# Patient Record
Sex: Female | Born: 2000 | Race: White | Hispanic: No | Marital: Single
Health system: Midwestern US, Community
[De-identification: ages and names within clinical notes are randomized; demographics above are authoritative.]

## PROBLEM LIST (undated history)

## (undated) DIAGNOSIS — Z113 Encounter for screening for infections with a predominantly sexual mode of transmission: Secondary | ICD-10-CM

## (undated) DIAGNOSIS — F909 Attention-deficit hyperactivity disorder, unspecified type: Secondary | ICD-10-CM

## (undated) HISTORY — DX: Attention-deficit hyperactivity disorder, unspecified type: F90.9

## (undated) HISTORY — PX: OTHER SURGICAL HISTORY: SHX169

---
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
## 2019-01-08 ENCOUNTER — Encounter: Payer: Self-pay | Admitting: Family Medicine

## 2019-01-08 ENCOUNTER — Ambulatory Visit (HOSPITAL_BASED_OUTPATIENT_CLINIC_OR_DEPARTMENT_OTHER): Payer: Medicaid Other | Admitting: Pharmacist

## 2019-01-08 ENCOUNTER — Ambulatory Visit: Payer: Medicaid Other | Attending: Family Medicine | Admitting: Family Medicine

## 2019-01-08 ENCOUNTER — Other Ambulatory Visit: Payer: Self-pay

## 2019-01-08 VITALS — BP 114/78 | HR 59 | Temp 98.4°F | Resp 16 | Ht 63.0 in | Wt 124.2 lb

## 2019-01-08 DIAGNOSIS — E010 Iodine-deficiency related diffuse (endemic) goiter: Secondary | ICD-10-CM | POA: Diagnosis present

## 2019-01-08 DIAGNOSIS — Z23 Encounter for immunization: Secondary | ICD-10-CM

## 2019-01-08 DIAGNOSIS — M25561 Pain in right knee: Secondary | ICD-10-CM | POA: Diagnosis not present

## 2019-01-08 DIAGNOSIS — N926 Irregular menstruation, unspecified: Secondary | ICD-10-CM

## 2019-01-08 DIAGNOSIS — K5900 Constipation, unspecified: Secondary | ICD-10-CM | POA: Diagnosis not present

## 2019-01-08 DIAGNOSIS — R7989 Other specified abnormal findings of blood chemistry: Secondary | ICD-10-CM | POA: Diagnosis not present

## 2019-01-08 DIAGNOSIS — Z79899 Other long term (current) drug therapy: Secondary | ICD-10-CM | POA: Diagnosis not present

## 2019-01-08 MED ORDER — POLYETHYLENE GLYCOL 3350 17 GM/SCOOP PO POWD: ORAL | 99 refills | Status: DC

## 2019-01-08 NOTE — Progress Notes (Signed)
Patient presents for vaccination against influenza per orders of Dr. Fulp. Consent given. Counseling provided. No contraindications exists. Vaccine administered without incident.   

## 2019-01-08 NOTE — Patient Instructions (Signed)

## 2019-01-08 NOTE — Progress Notes (Signed)
Subjective:  Patient ID: Kristen Boone, female    DOB: [birth year]  Age: 18 y.o. MRN: [id number]  CC: New Patient (Initial Visit)   HPI Kristen Boone presents to establish care.  She reports that she is too old to attend the doctor's office where she was seen in the past.  She denies any significant past medical history/medical issues.  She states that she does not currently take any daily medications.  She did have her wisdom teeth extracted earlier this week.  She has had no other surgeries.  She has had issues with her periods being irregular and she is not sure when she had her last menstrual period.  She states that she is never been sexually active.  She denies any current symptoms.  She has had no fever or chills, no changes in vision, no sore throat or difficulty swallowing, no shortness of breath or cough, no chest pain or palpitations.  She denies any abdominal pain but upon questioning, she states that she has had longstanding issues with constipation and she normally only has a bowel movement once or twice per week.  She has been on MiraLAX in the past.  She states that this did help.  She cannot recall when she last had a bowel movement.  She is currently on ibuprofen as well as oxycodone after her dental extractions.  She denies nausea or vomiting, no blood in the stool and no black stools.  She does occasionally have pain in her right knee but denies any injury to the right knee.  She denies any peripheral edema/swelling.  Past Medical History:  Diagnosis Date   ADHD (attention deficit hyperactivity disorder)     Past Surgical History:  Procedure Laterality Date   extraction of wisdom teeth      Family History  Problem Relation Age of Onset   Learning disabilities Mother    Hypertension Maternal Grandmother    Asthma Maternal Grandmother     Social History   Tobacco Use   Smoking status: Never Smoker   Smokeless tobacco: Never Used  Substance Use Topics    Alcohol use: No    ROS Review of Systems  Constitutional: Negative for chills, fatigue and fever.  HENT: Positive for dental problem (s/p extraction of wisdon teeth). Negative for sore throat and trouble swallowing.   Eyes: Negative for photophobia and visual disturbance.  Respiratory: Negative for cough and shortness of breath.   Cardiovascular: Negative for chest pain, palpitations and leg swelling.  Gastrointestinal: Positive for constipation. Negative for abdominal pain, blood in stool, diarrhea and nausea.  Endocrine: Negative for cold intolerance, heat intolerance, polydipsia, polyphagia and polyuria.  Genitourinary: Positive for menstrual problem (irregular menses; denies ever being sexually active). Negative for dysuria and frequency.  Musculoskeletal: Positive for arthralgias (recurrent right knee pain). Negative for back pain and gait problem.  Allergic/Immunologic: Positive for food allergies. Negative for environmental allergies and immunocompromised state.  Neurological: Negative for dizziness and headaches.  Hematological: Negative for adenopathy. Does not bruise/bleed easily.    Objective:   Today's Vitals: BP 114/78    Pulse (!) 59    Temp 98.4 F (36.9 C) (Oral)    Resp 16    Ht 5\' 3"  (1.6 m)    Wt 124 lb 3.2 oz (56.3 kg)    SpO2 100%    BMI 22.00 kg/m   Physical Exam Vitals signs and nursing note reviewed.  Constitutional:      Appearance: Normal appearance.  Neck:  Musculoskeletal: Normal range of motion and neck supple. No muscular tenderness.     Vascular: No carotid bruit.     Comments: Mild symmetric thyroid enlargement- no palpable nodules Cardiovascular:     Rate and Rhythm: Normal rate.  Pulmonary:     Effort: Pulmonary effort is normal.     Breath sounds: Normal breath sounds.  Abdominal:     General: There is distension (slightly distended and firm lower abdomen but no tenderness).     Palpations: Abdomen is soft.     Tenderness: There is no  abdominal tenderness. There is no right CVA tenderness, left CVA tenderness, guarding or rebound.  Musculoskeletal: Normal range of motion.        General: No swelling, tenderness or deformity.     Right lower leg: No edema.     Left lower leg: No edema.     Comments: No reproducible right knee pain on exam  Lymphadenopathy:     Cervical: No cervical adenopathy.  Skin:    General: Skin is warm and dry.     Findings: No rash.  Neurological:     General: No focal deficit present.     Mental Status: She is alert and oriented to person, place, and time.     Assessment & Plan:  1. Thyromegaly Patient with thyromegaly on physical examination.  Patient will have thyroid panel and will be scheduled for thyroid ultrasound. - Thyroid Panel With TSH - US THYROID; Future  2. Constipation, unspecified constipation type She reports longstanding issues with constipation.  Patient will have BMP and TSH.  Review of chart she actually had elevated creatinine to 1.30 on BMP done 06/28/2016 in the emergency department.  Patient is encouraged to make sure that she is drinking enough water daily.  Patient will be notified if TSH shows any abnormality requiring further evaluation or medication treatment as her constipation could be related to hypothyroidism.  Prescription provided for MiraLAX to take once daily with 8 ounces of water or juice to help with her constipation.  If her blood work is normal and she continues to have issues with constipation, patient will be referred to gastroenterology for further evaluation and treatment. - Basic Metabolic Panel - Thyroid Panel With TSH - polyethylene glycol powder (MIRALAX) 17 GM/SCOOP powder; Mix 1 capful in 8 oz liquid & drink daily as needed  Dispense: 255 g; Refill: prn  3. Irregular menses She reports that her periods have been irregular and she is not sure when she last had her menses.  Patient denies any possibility of pregnancy as she states that she has  never been sexually active.  Patient will have thyroid panel with TSH to see if she may have a thyroid disorder as the cause of her irregular menses. - Thyroid Panel With TSH  4. Need for immunization against influenza Patient was offered and agreed to have influenza immunization at today's visit.  Handout was also provided regarding the immunization and possible side effects.  Outpatient Encounter Medications as of 01/08/2019  Medication Sig   FLUoxetine (PROZAC) 20 MG capsule Take 20 mg by mouth every morning.   hydrOXYzine (ATARAX/VISTARIL) 50 MG tablet Take 50-100 mg by mouth daily as needed for anxiety (and agitation).    methylphenidate (METADATE CD) 20 MG CR capsule One tab by mouth in AM.   polyethylene glycol powder (MIRALAX) 17 GM/SCOOP powder Mix 1 capful in 8 oz liquid & drink daily as needed   risperiDONE (RISPERDAL) 1 MG tablet Take  0.5 mg by mouth 2 (two) times daily.   traZODone (DESYREL) 50 MG tablet Take 50 mg by mouth at bedtime.   [DISCONTINUED] polyethylene glycol powder (MIRALAX) powder Mix 1 capful in 8 oz liquid & drink daily as needed (Patient not taking: Reported on 01/08/2019)   No facility-administered encounter medications on file as of 01/08/2019.   Patient denied being on any medications or having any chronic medical issues however her chart list several psychiatric medications.  There is also mention in past records that patient may have learning disability/borderline mental retardation and patient was unaccompanied at today's visit therefore there was no one else to ask about patient's current medications or prior diagnoses.  An After Visit Summary was printed and given to the patient.  Follow-up: Return in about 6 weeks (around 02/19/2019) for thyroid US results/constipation.    Cain Saupeammie Arlington Sigmund MD

## 2019-01-09 LAB — BASIC METABOLIC PANEL WITH GFR
BUN/Creatinine Ratio: 10 (ref 9–23)
BUN: 6 mg/dL (ref 6–20)
CO2: 20 mmol/L (ref 20–29)
Calcium: 9.6 mg/dL (ref 8.7–10.2)
Chloride: 103 mmol/L (ref 96–106)
Creatinine, Ser: 0.63 mg/dL (ref 0.57–1.00)
GFR calc Af Amer: 151 mL/min/1.73
GFR calc non Af Amer: 131 mL/min/1.73
Glucose: 82 mg/dL (ref 65–99)
Potassium: 4.1 mmol/L (ref 3.5–5.2)
Sodium: 138 mmol/L (ref 134–144)

## 2019-01-09 LAB — THYROID PANEL WITH TSH
Free Thyroxine Index: 3.1 (ref 1.2–4.9)
T3 Uptake Ratio: 31 % (ref 23–35)
T4, Total: 10.1 ug/dL (ref 4.5–12.0)
TSH: 1.02 u[IU]/mL (ref 0.450–4.500)

## 2019-01-13 ENCOUNTER — Telehealth (HOSPITAL_COMMUNITY): Payer: Self-pay

## 2019-01-13 ENCOUNTER — Ambulatory Visit (HOSPITAL_COMMUNITY): Admission: RE | Admit: 2019-01-13 | Payer: Medicaid Other | Source: Ambulatory Visit

## 2019-01-26 ENCOUNTER — Encounter: Payer: Self-pay | Admitting: *Deleted

## 2019-02-19 ENCOUNTER — Encounter: Payer: Self-pay | Admitting: Family Medicine

## 2019-02-19 ENCOUNTER — Other Ambulatory Visit: Payer: Self-pay

## 2019-02-19 ENCOUNTER — Ambulatory Visit: Payer: Medicaid Other | Attending: Family Medicine | Admitting: Family Medicine

## 2019-02-19 DIAGNOSIS — G4700 Insomnia, unspecified: Secondary | ICD-10-CM | POA: Diagnosis not present

## 2019-02-19 DIAGNOSIS — K5900 Constipation, unspecified: Secondary | ICD-10-CM

## 2019-02-19 MED ORDER — ZOLPIDEM TARTRATE 5 MG PO TABS: 5.0000 mg | ORAL_TABLET | Freq: Every evening | ORAL | 0 refills | Status: DC | PRN

## 2019-02-19 NOTE — Progress Notes (Signed)
Virtual Visit via Telephone Note  I connected with Kristen Boone on 02/19/19 at  1:30 PM EDT by telephone and verified that I am speaking with the correct person using two identifiers.   I discussed the limitations, risks, security and privacy concerns of performing an evaluation and management service by telephone and the availability of in person appointments. I also discussed with the patient that there may be a patient responsible charge related to this service. The patient expressed understanding and agreed to proceed.  Patient Location: Home Provider Location: [facility] Others participating in call: none   History of Present Illness:      18 yo female who is seen in follow-up of constipation and she has new complaint of issues with insomnia since January of this year.  She reports some continued issues with constipation as she was not able to pick up the MiraLAX from the pharmacy yet.  She denies abdominal pain-no nausea or vomiting.  She does have issues with inability to fall asleep and stay asleep at night which then causes her to fall asleep during the day.  She would like to get back into a regular sleep cycle.  She reports that sleep issues have been occurring since January of this year.  She was prescribed a medicine previously to help with sleep but she states that the medicine did not really help with her sleep so she discontinued issues and when she told the prescriber that the medication was not helping, she was told that the dose could not be increased.  She denies any current issues with headaches or dizziness.  She does have some fatigue which she believes may be related to her sleep issues.  She denies any chest pain or palpitations, no shortness of breath or cough.  She feels that her mental health issues are controlled with her current medicines.  She denies any suicidal thoughts or ideations.   Past Medical History:  Diagnosis Date  . ADHD (attention deficit  hyperactivity disorder)     Past Surgical History:  Procedure Laterality Date  . extraction of wisdom teeth      Family History  Problem Relation Age of Onset  . Learning disabilities Mother   . Hypertension Maternal Grandmother   . Asthma Maternal Grandmother     Social History   Tobacco Use  . Smoking status: Never Smoker  . Smokeless tobacco: Never Used  Substance Use Topics  . Alcohol use: No  . Drug use: Yes    Types: Marijuana     Allergies  Allergen Reactions  . Kiwi Extract Hives  . Peach [Prunus Persica] Rash       Observations/Objective: No vital signs or physical exam conducted as visit was done via telephone  Assessment and Plan: 1. Constipation, unspecified constipation type Patient was encouraged to pick up RX from pharmacy for Miralax to take as needed for constipation. Medication was sent to her pharmacy at her last visit but patient has not been able to pick up the prescription yet.  Educational material on constipation was also provided at her last visit.  She is encouraged to increase water and fiber in the diet.  Reviewed labs from last visit with the patient as she had normal thyroid test and normal basic metabolic panel.  2. Insomnia, unspecified type Patient reports issues with inability to fall asleep at night/insomnia.  She reports being on a medicine in the past to help with sleep and when I reviewed her medication list, she  believes that it was trazodone that she took that did not help with sleep.  Prescription has been sent into patient's pharmacy for Ambien 5 mg to take at bedtime to help with sleep.  Discussed that she needs to reestablish a sleep routine and determine what time she would like to go to bed and set an alarm for the time that she would like to wake up in the mornings.  She should also practice good sleep hygiene by making sure that she is in a quiet, dark room that is only used for sleep.  She should let someone else know that she is  taking medicine to help with sleep.  Patient should call back or return for follow-up if her sleep is not improved with the use of this medication or if she has any problems related to the medication. - zolpidem (AMBIEN) 5 MG tablet; Take 1 tablet (5 mg total) by mouth at bedtime as needed for sleep.  Dispense: 30 tablet; Refill: 0  Follow Up Instructions:Return for insomina- as needed; call if medication is not helping.    I discussed the assessment and treatment plan with the patient. The patient was provided an opportunity to ask questions and all were answered. The patient agreed with the plan and demonstrated an understanding of the instructions.   The patient was advised to call back or seek an in-person evaluation if the symptoms worsen or if the condition fails to improve as anticipated.  I provided 12 minutes of non-face-to-face time during this encounter.   Antony Blackbird, MD

## 2019-02-19 NOTE — Progress Notes (Signed)
Pt has not concerns today she would like to address. Has not picked up the miralax at this time.   Did did state she does have a hard time sleeping at night.

## 2019-06-12 ENCOUNTER — Encounter

## 2019-06-12 ENCOUNTER — Ambulatory Visit
Admit: 2019-06-12 | Discharge: 2019-06-12 | Payer: PRIVATE HEALTH INSURANCE | Attending: Advanced Practice Midwife | Primary: Pediatrics

## 2019-06-12 DIAGNOSIS — N898 Other specified noninflammatory disorders of vagina: Secondary | ICD-10-CM

## 2019-06-12 MED ORDER — METRONIDAZOLE 500 MG PO TABS
500 MG | ORAL_TABLET | Freq: Two times a day (BID) | ORAL | 1 refills | Status: AC
Start: 2019-06-12 — End: 2019-06-19

## 2019-06-12 MED ORDER — FLUCONAZOLE 150 MG PO TABS
150 MG | ORAL_TABLET | ORAL | 1 refills | Status: AC
Start: 2019-06-12 — End: ?

## 2019-06-12 NOTE — Progress Notes (Signed)
Chief Complaint:     Anne Hensley is a 19 y.o. female who presents here today for complaints of:      Chief Complaint   Patient presents with   ??? Vaginal Discharge     white and light yellow when wiping onset 3wks     History of Present Illness:     Increased vaginal discharge, itching - here today with complaints of:  Experiencing increased discharge and irritation.     ??? This is a new problem.  ??? The problem began approximately 3 week(s) ago and the symptoms are described as moderate.  ??? Pain:  Denies  ??? Associated symptoms:      o Denies fever, headache, malaise, lymphadenopathy, myalgias.    o Denies dysuria, frequency, urgency.    o Denies vaginal odor.    ??? Treatments tried:   Changed soaps, symptoms improved somewhat.  ??? Sexual Health:  ?? Gender of sexual partners:  Female, has had a female partner previously  ?? Use of barrier protection:  No  ?? Exposure to a partner with a known sexually transmitted disease within the last 90 days:  Possibly, requesting full STD screening.  ?? Number of sexual contacts within the past 12 months:  1  ?? Personal past history  of sexually transmitted diseases:  Denies    Past Medical, Surgical, and Family History:     Allergies:  Patient has no known allergies.  Patient's last menstrual period was 05/29/2019.  Obstetrical History:  No obstetric history on file.     No past medical history on file.  No past surgical history on file.  No family history on file.  Medications:     No current outpatient medications on file prior to visit.     No current facility-administered medications on file prior to visit.      Review of Systems:     Review of Systems   Constitutional: Negative for chills, fatigue and fever.   Respiratory: Negative for cough and shortness of breath.    Gastrointestinal: Negative for abdominal pain, constipation, diarrhea, nausea and vomiting.   Genitourinary: Positive for vaginal discharge. Negative for difficulty urinating, dysuria, menstrual problem, pelvic  pain and vaginal bleeding.   Neurological: Negative for dizziness and headaches.   All other systems reviewed and are negative.    Physical Exam:     Vitals:  BP 100/68    Ht (!) 4\' 11"  (1.499 m)    Wt (!) 97 lb (44 kg)    LMP 05/29/2019    BMI 19.59 kg/m??     Physical Exam  Vitals signs and nursing note reviewed.   Constitutional:       General: She is not in acute distress.     Appearance: Normal appearance. She is not ill-appearing, toxic-appearing or diaphoretic.   HENT:      Head: Normocephalic.      Nose: No congestion or rhinorrhea.      Mouth/Throat:      Mouth: Mucous membranes are moist.   Eyes:      General: No scleral icterus.        Right eye: No discharge.         Left eye: No discharge.   Neck:      Musculoskeletal: Normal range of motion and neck supple.   Cardiovascular:      Rate and Rhythm: Normal rate and regular rhythm.      Pulses: Normal pulses.   Pulmonary:  Effort: Pulmonary effort is normal. No respiratory distress.   Abdominal:      Palpations: Abdomen is soft.      Hernia: There is no hernia in the left inguinal area or right inguinal area.   Genitourinary:     General: Normal vulva.      Exam position: Lithotomy position.      Pubic Area: No rash or pubic lice.       Labia:         Right: No rash, tenderness, lesion or injury.         Left: No rash, tenderness, lesion or injury.       Urethra: No prolapse, urethral pain, urethral swelling or urethral lesion.      Vagina: No signs of injury and foreign body. Vaginal discharge and erythema present. No tenderness, bleeding, lesions or prolapsed vaginal walls.      Cervix: No cervical motion tenderness, discharge, friability, lesion, erythema, cervical bleeding or eversion.      Uterus: Not deviated, not enlarged, not fixed, not tender and no uterine prolapse.       Adnexa:         Right: No mass, tenderness or fullness.          Left: No mass, tenderness or fullness.        Rectum: No mass or external hemorrhoid.   Musculoskeletal:  Normal range of motion.      Right lower leg: No edema.      Left lower leg: No edema.   Lymphadenopathy:      Lower Body: No right inguinal adenopathy. No left inguinal adenopathy.   Skin:     General: Skin is warm and dry.      Capillary Refill: Capillary refill takes less than 2 seconds.      Coloration: Skin is not jaundiced or pale.   Neurological:      Mental Status: She is alert and oriented to person, place, and time. Mental status is at baseline.      Motor: No weakness.      Coordination: Coordination normal.      Gait: Gait normal.   Psychiatric:         Mood and Affect: Mood normal.         Behavior: Behavior normal.       Assessment:      Diagnosis Orders   1. Vaginal discharge  Wet Prep, Genital   2. Bacterial vaginitis  metroNIDAZOLE (FLAGYL) 500 MG tablet   3. Candidal vaginitis  fluconazole (DIFLUCAN) 150 MG tablet   4. Screening for STD (sexually transmitted disease)  Wet Prep, Genital    C.trachomatis N.gonorrhoeae DNA    Hepatitis B Surface Antigen    Hepatitis C Antibody    HIV-1,2 Combo Ag/Ab By CIA, Reflexive Panel    Herpes simplex virus (HSV) I/II antibodies IgG & IgM w/ reflex    RPR Reflex to Titer and TPPA     Plan:     1. Vaginal Discharge, Irritation  External irritation, redness  Moderate yellow discharge  Vaginitis, Bacterial - Rx for Flagyl  Vaginitis, Candidal - Rx for Diflucan    2. 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    Orders Placed This Encounter   Procedures   ??? Wet Prep, Genital  Standing Status:   Future     Standing Expiration Date:   06/11/2020   ??? C.trachomatis N.gonorrhoeae DNA     Standing Status:   Future     Standing Expiration Date:   06/11/2020   ??? Hepatitis B Surface Antigen     Standing Status:   Future     Standing Expiration Date:   06/11/2020   ??? Hepatitis C Antibody     Standing Status:   Future     Standing  Expiration Date:   06/11/2020   ??? HIV-1,2 Combo Ag/Ab By CIA, Reflexive Panel     Standing Status:   Future     Standing Expiration Date:   06/11/2020   ??? Herpes simplex virus (HSV) I/II antibodies IgG & IgM w/ reflex     Standing Status:   Future     Standing Expiration Date:   06/11/2020   ??? RPR Reflex to Titer and TPPA     Standing Status:   Future     Standing Expiration Date:   06/11/2020     Orders Placed This Encounter   Medications   ??? fluconazole (DIFLUCAN) 150 MG tablet     Sig: Take 1 tablet every 3 days for 3 doses.  For example:  Take on Monday, Thursday, Sunday.     Dispense:  3 tablet     Refill:  1   ??? metroNIDAZOLE (FLAGYL) 500 MG tablet     Sig: Take 1 tablet by mouth 2 times daily for 7 days     Dispense:  14 tablet     Refill:  [place], APRN - CNM

## 2019-06-13 LAB — WET PREP, GENITAL: Yeast, Wet Prep: NONE SEEN

## 2019-06-13 LAB — HIV SCREEN: HIV Ag/Ab: NONREACTIVE

## 2019-06-13 LAB — HEPATITIS C ANTIBODY: Hep C Ab Interp: NONREACTIVE

## 2019-06-13 LAB — TRICHOMONAS BY EIA: TRICHOMONAS VAGINALIS SCREEN: NEGATIVE

## 2019-06-13 LAB — RPR: RPR: NONREACTIVE

## 2019-06-13 LAB — HEPATITIS B SURFACE ANTIGEN: Hep B S Ag Interp: NONREACTIVE

## 2019-06-16 LAB — C.TRACHOMATIS N.GONORRHOEAE DNA
C. trachomatis DNA: NEGATIVE
N. gonorrhoeae DNA: NEGATIVE

## 2019-06-18 LAB — HERPES SIMPLEX VIRUS (HSV) I/II ANTIBODIES IGG & IGM W/ REFLEX
Herpes Type 1/2 IgM Combined: 0.67 IV (ref <=0.89)
Herpes Type I/II IgG Combined: 0.1 IV

## 2019-07-14 NOTE — Telephone Encounter (Signed)
 Please call patient and make aware of previous message.

## 2019-07-14 NOTE — Telephone Encounter (Signed)
Pt is calling for her lab results.

## 2019-07-14 NOTE — Telephone Encounter (Signed)
Please let her know that all of her lab results were negative.  For some reason the HSV (Herpes) is still in a preliminary status, but it is negative as well.

## 2019-07-21 NOTE — Telephone Encounter (Signed)
Pt is aware

## 2020-01-27 ENCOUNTER — Encounter (HOSPITAL_COMMUNITY): Payer: Self-pay

## 2020-01-27 ENCOUNTER — Ambulatory Visit (HOSPITAL_COMMUNITY)
Admission: EM | Admit: 2020-01-27 | Discharge: 2020-01-29 | Disposition: A | Discharge status: Home / Self Care | Payer: Medicaid Other | Attending: Psychiatric/Mental Health | Admitting: Psychiatric/Mental Health

## 2020-01-27 ENCOUNTER — Other Ambulatory Visit: Payer: Self-pay

## 2020-01-27 DIAGNOSIS — F315 Bipolar disorder, current episode depressed, severe, with psychotic features: Secondary | ICD-10-CM | POA: Insufficient documentation

## 2020-01-27 DIAGNOSIS — R45851 Suicidal ideations: Secondary | ICD-10-CM | POA: Insufficient documentation

## 2020-01-27 DIAGNOSIS — Z20822 Contact with and (suspected) exposure to covid-19: Secondary | ICD-10-CM | POA: Insufficient documentation

## 2020-01-27 DIAGNOSIS — R45850 Homicidal ideations: Secondary | ICD-10-CM | POA: Insufficient documentation

## 2020-01-27 LAB — POCT URINE DRUG SCREEN - MANUAL ENTRY (I-SCREEN)
POC Amphetamine UR: NOT DETECTED
POC Buprenorphine (BUP): NOT DETECTED
POC Cocaine UR: POSITIVE — AB
POC Marijuana UR: POSITIVE — AB
POC Methadone UR: NOT DETECTED
POC Methamphetamine UR: NOT DETECTED
POC Morphine: NOT DETECTED
POC Oxazepam (BZO): NOT DETECTED
POC Oxycodone UR: NOT DETECTED
POC Secobarbital (BAR): NOT DETECTED

## 2020-01-27 MED ORDER — GABAPENTIN 100 MG PO CAPS
100.0000 mg | ORAL_CAPSULE | Freq: Three times a day (TID) | ORAL | Status: DC
  Administered 2020-01-27 – 2020-01-29 (×4): 100 mg via ORAL
  Filled 2020-01-27 (×4): qty 1
  Filled 2020-01-27: qty 21

## 2020-01-27 MED ORDER — HYDROXYZINE HCL 25 MG PO TABS: 25.0000 mg | ORAL_TABLET | Freq: Three times a day (TID) | ORAL | Status: DC | PRN

## 2020-01-27 MED ORDER — QUETIAPINE FUMARATE 100 MG PO TABS
100.0000 mg | ORAL_TABLET | Freq: Every day | ORAL | Status: DC
  Administered 2020-01-27 – 2020-01-28 (×2): 100 mg via ORAL
  Filled 2020-01-27 (×2): qty 1
  Filled 2020-01-27: qty 7

## 2020-01-27 MED ORDER — TRAZODONE HCL 50 MG PO TABS: 50.0000 mg | ORAL_TABLET | Freq: Every evening | ORAL | Status: DC | PRN

## 2020-01-27 MED ORDER — ALUM & MAG HYDROXIDE-SIMETH 200-200-20 MG/5ML PO SUSP: 30.0000 mL | ORAL | Status: DC | PRN

## 2020-01-27 MED ORDER — MAGNESIUM HYDROXIDE 400 MG/5ML PO SUSP: 30.0000 mL | Freq: Every day | ORAL | Status: DC | PRN

## 2020-01-27 MED ORDER — ZIPRASIDONE MESYLATE 20 MG IM SOLR: 20.0000 mg | INTRAMUSCULAR | Status: DC | PRN

## 2020-01-27 MED ORDER — LORAZEPAM 1 MG PO TABS: 1.0000 mg | ORAL_TABLET | ORAL | Status: DC | PRN

## 2020-01-27 MED ORDER — OLANZAPINE 5 MG PO TBDP: 5.0000 mg | ORAL_TABLET | Freq: Three times a day (TID) | ORAL | Status: DC | PRN

## 2020-01-27 MED ORDER — ACETAMINOPHEN 325 MG PO TABS: 650.0000 mg | ORAL_TABLET | Freq: Four times a day (QID) | ORAL | Status: DC | PRN

## 2020-01-27 NOTE — ED Notes (Signed)
Patient belongings in locker 32 
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
## 2020-01-27 NOTE — ED Notes (Signed)
Placed pt's tan boots and camouflage pants in locker 32.
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
## 2020-01-28 LAB — CBC WITH DIFFERENTIAL/PLATELET
Abs Immature Granulocytes: 0.01 10*3/uL (ref 0.00–0.07)
Basophils Absolute: 0 10*3/uL (ref 0.0–0.1)
Basophils Relative: 1 %
Eosinophils Absolute: 0.1 10*3/uL (ref 0.0–0.5)
Eosinophils Relative: 2 %
HCT: 33.7 % — ABNORMAL LOW (ref 36.0–46.0)
Hemoglobin: 10.2 g/dL — ABNORMAL LOW (ref 12.0–15.0)
Immature Granulocytes: 0 %
Lymphocytes Relative: 52 %
Lymphs Abs: 2.3 10*3/uL (ref 0.7–4.0)
MCH: 22.5 pg — ABNORMAL LOW (ref 26.0–34.0)
MCHC: 30.3 g/dL (ref 30.0–36.0)
MCV: 74.4 fL — ABNORMAL LOW (ref 80.0–100.0)
Monocytes Absolute: 0.4 10*3/uL (ref 0.1–1.0)
Monocytes Relative: 9 %
Neutro Abs: 1.6 10*3/uL — ABNORMAL LOW (ref 1.7–7.7)
Neutrophils Relative %: 36 %
Platelets: 247 10*3/uL (ref 150–400)
RBC: 4.53 MIL/uL (ref 3.87–5.11)
RDW: 16.2 % — ABNORMAL HIGH (ref 11.5–15.5)
WBC: 4.3 10*3/uL (ref 4.0–10.5)
nRBC: 0 % (ref 0.0–0.2)

## 2020-01-28 LAB — COMPREHENSIVE METABOLIC PANEL
ALT: 10 U/L (ref 0–44)
AST: 16 U/L (ref 15–41)
Albumin: 3.6 g/dL (ref 3.5–5.0)
Alkaline Phosphatase: 40 U/L (ref 38–126)
Anion gap: 9 (ref 5–15)
BUN: 7 mg/dL (ref 6–20)
CO2: 22 mmol/L (ref 22–32)
Calcium: 9.1 mg/dL (ref 8.9–10.3)
Chloride: 111 mmol/L (ref 98–111)
Creatinine, Ser: 0.71 mg/dL (ref 0.44–1.00)
GFR calc non Af Amer: >60 mL/min (ref >60)
Glucose, Bld: 74 mg/dL (ref 70–99)
Potassium: 3.7 mmol/L (ref 3.5–5.1)
Sodium: 142 mmol/L (ref 135–145)
Total Bilirubin: 0.3 mg/dL (ref 0.3–1.2)
Total Protein: 6.3 g/dL — ABNORMAL LOW (ref 6.5–8.1)

## 2020-01-28 LAB — HEMOGLOBIN A1C
Hgb A1c MFr Bld: 5.4 % (ref 4.8–5.6)
Mean Plasma Glucose: 108.28 mg/dL

## 2020-01-28 LAB — LIPID PANEL
Cholesterol: 156 mg/dL (ref 0–200)
HDL: 56 mg/dL (ref >40)
Total CHOL/HDL Ratio: 2.8 RATIO
Triglycerides: 44 mg/dL (ref <150)
VLDL: 9 mg/dL (ref 0–40)

## 2020-01-28 LAB — RESPIRATORY PANEL BY RT PCR (FLU A&B, COVID)
Influenza A by PCR: NEGATIVE
Influenza B by PCR: NEGATIVE
SARS Coronavirus 2 by RT PCR: NEGATIVE

## 2020-01-28 LAB — ETHANOL: Alcohol, Ethyl (B): <10 mg/dL (ref <10)

## 2020-01-28 NOTE — ED Notes (Signed)
Pt sleeping at present, no distress noted, monitoring for safety. 

## 2020-01-28 NOTE — Progress Notes (Signed)
CSW was able to speak to [facility].  Was advised that patient would have to call and request intake interview over the phone.  At this time [facility] is only accepting intake calls from 9am to 4pm.  Patient needs to call first thing in the morning if interested in this option.  Contact information is [phone], [employer].  CSW left message with Elis[facility] ([facility] in [place] to inquire about open beds.  Contact# [phone].   Ladoris Gene MSW,LCSWA,LCASA Clinical Social Worker  [place] [facility], CSW [phone] (cell)

## 2020-01-28 NOTE — ED Notes (Signed)
Pt A&O x 4, sleeping at present, no distress noted.  Monitoring for safety. 

## 2020-01-28 NOTE — ED Notes (Signed)
Pt resting on pull out with eyes closed unlabored respirations. NAD. Continuous monitoring continues.  
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
## 2020-01-29 MED ORDER — GABAPENTIN 100 MG PO CAPS: 100.0000 mg | ORAL_CAPSULE | Freq: Three times a day (TID) | ORAL | 0 refills | Status: AC

## 2020-01-29 MED ORDER — QUETIAPINE FUMARATE 100 MG PO TABS: 100.0000 mg | ORAL_TABLET | Freq: Every day | ORAL | 0 refills | Status: AC

## 2020-01-29 NOTE — ED Notes (Signed)
Breakfast given: Oatmeal

## 2020-01-29 NOTE — ED Notes (Signed)
Resting with eyes closed. Rise and fall of chest noted. Will continue to monitor for safety

## 2020-01-29 NOTE — ED Notes (Signed)
Patient requested snack; given chips

## 2020-01-29 NOTE — Discharge Instructions (Addendum)

## 2020-01-29 NOTE — ED Provider Notes (Signed)
FBC/OBS ASAP Discharge Summary  Date and Time: 01/29/2020 10:26 AM  Name: Kristen Boone  MRN:  [id number]   Discharge Diagnoses:  Final diagnoses:  Bipolar disorder, current episode depressed, severe, with psychotic features (HCC)    Subjective: "I am okay."  Stay Summary: The patient was seen and examined face to face. She is alert and oriented x 4. She is calm, cooperative and participates during the assessment. She denies suicidal ideations. She denies homicidal ideations. She denies auditory and visual hallucinations. She does not appear to be responding to internal or external stimuli. She stated that she contacted the two shelters on the list of housing resources that was provided to her and left a voicemail. She expressed that she's ready to leave today and that her sister lives in a motel, and maybe she can stay with her. She reported that she isn't able to contact her sister because her phone is currently off and she can only contact her sister through Therapist, art.  She reported that her phone is off because she needs to pay the bill and then she will be able to contact her sister on facebook messenger, or find a place to stay.   We discussed treatment options that included the [facility] rescue mission and provided her with a list of local shelters in the area. She declined to go to the [facility] because it's too far. She was provided with outpatient services for follow-up at the [facility] for psychiatry and medication management. She was provided with sample medications and prescriptions for medication compliance.   The patient does not meet criteria for inpatient treatment. Labs reviewed. Vital signs reviewed. Medications reviewed.   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  Past Medical History:  Past Medical History:  Diagnosis Date   ADHD (attention deficit hyperactivity  disorder)     Past Surgical History:  Procedure Laterality Date   extraction of wisdom teeth     Family History:  Family History  Problem Relation Age of Onset   Learning disabilities Mother    Hypertension Maternal Grandmother    Asthma Maternal Grandmother    Family Psychiatric History: Unknown. Social History:  Social History   Substance and Sexual Activity  Alcohol Use No     Social History   Substance and Sexual Activity  Drug Use Yes   Types: Marijuana    Social History   Socioeconomic History   Marital status: Single    Spouse name: Not on file   Number of children: Not on file   Years of education: Not on file   Highest education level: Not on file  Occupational History   Not on file  Tobacco Use   Smoking status: Never Smoker   Smokeless tobacco: Never Used  Substance and Sexual Activity   Alcohol use: No   Drug use: Yes    Types: Marijuana   Sexual activity: Never  Other Topics Concern   Not on file  Social History Narrative   [employer]   5 th grade.   Grandmother has custody.   IQ of 74.   IEP in reading and math., mainstream classes.   ADHD - on metadate CD.   Dr.Spencer- optho.   Nose bleeds -  Cauterized bt Dr. Suszanne Conners   Social Determinants of Health   Financial Resource Strain:    Difficulty of Paying Living Expenses: Not on file  Food Insecurity:  Worried About Programme researcher, broadcasting/film/video in the Last Year: Not on file   [employer] of Food in the Last Year: Not on file  Transportation Needs:    Lack of Transportation (Medical): Not on file   Lack of Transportation (Non-Medical): Not on file  Physical Activity:    Days of Exercise per Week: Not on file   Minutes of Exercise per Session: Not on file  Stress:    Feeling of Stress : Not on file  Social Connections:    Frequency of Communication with Friends and Family: Not on file   Frequency of Social Gatherings with Friends and Family: Not on file    Attends Religious Services: Not on file   Active Member of Clubs or Organizations: Not on file   Attends Banker Meetings: Not on file   Marital Status: Not on file   SDOH:  SDOH Screenings   Alcohol Screen:    Last Alcohol Screening Score (AUDIT): Not on file  Depression (PHQ2-9): Medium Risk   PHQ-2 Score: 5  Financial Resource Strain:    Difficulty of Paying Living Expenses: Not on file  Food Insecurity:    Worried About Programme researcher, broadcasting/film/video in the Last Year: Not on file   [employer] of Food in the Last Year: Not on file  Housing:    Last Housing Risk Score: Not on file  Physical Activity:    Days of Exercise per Week: Not on file   Minutes of Exercise per Session: Not on file  Social Connections:    Frequency of Communication with Friends and Family: Not on file   Frequency of Social Gatherings with Friends and Family: Not on file   Attends Religious Services: Not on file   Active Member of Clubs or Organizations: Not on file   Attends Banker Meetings: Not on file   Marital Status: Not on file  Stress:    Feeling of Stress : Not on file  Tobacco Use: Low Risk    Smoking Tobacco Use: Never Smoker   Smokeless Tobacco Use: Never Used  Transportation Needs:    Freight forwarder (Medical): Not on file   Lack of Transportation (Non-Medical): Not on file    Has this patient used any form of tobacco in the last 30 days? (Cigarettes, Smokeless Tobacco, Cigars, and/or Pipes) A prescription for an FDA-approved tobacco cessation medication was offered at discharge and the patient refused  Current Medications:  Current Facility-Administered Medications  Medication Dose Route Frequency Provider Last Rate Last Admin   acetaminophen (TYLENOL) tablet 650 mg  650 mg Oral Q6H PRN Aldean Baker, NP       alum & mag hydroxide-simeth (MAALOX/MYLANTA) 200-200-20 MG/5ML suspension 30 mL  30 mL Oral Q4H PRN Aldean Baker, NP        gabapentin (NEURONTIN) capsule 100 mg  100 mg Oral TID Aldean Baker, NP   100 mg at 01/29/20 0901   hydrOXYzine (ATARAX/VISTARIL) tablet 25 mg  25 mg Oral TID PRN Aldean Baker, NP       OLANZapine zydis (ZYPREXA) disintegrating tablet 5 mg  5 mg Oral Q8H PRN Aldean Baker, NP       And   LORazepam (ATIVAN) tablet 1 mg  1 mg Oral PRN Aldean Baker, NP       And   ziprasidone (GEODON) injection 20 mg  20 mg Intramuscular PRN Aldean Baker, NP  magnesium hydroxide (MILK OF MAGNESIA) suspension 30 mL  30 mL Oral Daily PRN Aldean BakerSykes, Janet E, NP       QUEtiapine (SEROQUEL) tablet 100 mg  100 mg Oral QHS Aldean BakerSykes, Janet E, NP   100 mg at 01/28/20 2141   traZODone (DESYREL) tablet 50 mg  50 mg Oral QHS PRN Aldean BakerSykes, Janet E, NP       Current Outpatient Medications  Medication Sig Dispense Refill   gabapentin (NEURONTIN) 100 MG capsule Take 1 capsule (100 mg total) by mouth 3 (three) times daily. 90 capsule 0   QUEtiapine (SEROQUEL) 100 MG tablet Take 1 tablet (100 mg total) by mouth at bedtime. 30 tablet 0    PTA Medications: (Not in a hospital admission)   Musculoskeletal  Strength & Muscle Tone: within normal limits Gait & Station: normal Patient leans: N/A  Psychiatric Specialty Exam  Presentation  General Appearance: Appropriate for Environment  Eye Contact:Fair  Speech:Blocked  Speech Volume:Normal  Handedness:Right   Mood and Affect  Mood:Anxious  Affect:Appropriate   Thought Process  Thought Processes:Coherent  Descriptions of Associations:Intact  Orientation:Full (Time, Place and Person)  Thought Content:WDL  Hallucinations:Hallucinations: None  Ideas of Reference:None  Suicidal Thoughts:Suicidal Thoughts: No SI Active Intent and/or Plan: Without Plan  Homicidal Thoughts:Homicidal Thoughts: No   Sensorium  Memory:Immediate Fair;Recent Fair;Remote Fair  Judgment:Fair  Insight:Fair   Executive Functions  Concentration:Fair  Attention  Span:Fair  Recall:Fair  Fund of Knowledge:Fair  Language:Fair   Psychomotor Activity  Psychomotor Activity:Psychomotor Activity: Normal   Assets  Assets:Communication Skills;Desire for Improvement;Physical Health;Leisure Time   Sleep  Sleep:Sleep: Fair   Physical Exam  Physical Exam Vitals and nursing note reviewed.  Constitutional:      Appearance: She is well-developed.  HENT:     Head: Normocephalic.  Eyes:     Pupils: Pupils are equal, round, and reactive to light.  Cardiovascular:     Rate and Rhythm: Normal rate.  Pulmonary:     Effort: Pulmonary effort is normal.  Musculoskeletal:        General: Normal range of motion.  Neurological:     Mental Status: She is alert and oriented to person, place, and time.    Review of Systems  Constitutional: Negative.   HENT: Negative.   Eyes: Negative.   Respiratory: Negative.   Cardiovascular: Negative.   Gastrointestinal: Negative.   Genitourinary: Negative.   Musculoskeletal: Negative.   Skin: Negative.   Neurological: Negative.   Endo/Heme/Allergies: Negative.   Psychiatric/Behavioral: Positive for suicidal ideas.   Blood pressure 105/61, pulse 71, temperature 98 F (36.7 C), temperature source Temporal, resp. rate 16, height 5\' 4"  (1.626 m), weight 118 lb (53.5 kg), SpO2 100 %. Body mass index is 20.25 kg/m.  Demographic Factors:  Adolescent or young adult, Low socioeconomic status and Unemployed  Loss Factors: Financial problems/change in socioeconomic status  Historical Factors: Impulsivity  Risk Reduction Factors:   Sense of responsibility to family  Continued Clinical Symptoms:  Bipolar Disorder:   Mixed State Previous Psychiatric Diagnoses and Treatments  Cognitive Features That Contribute To Risk:  None    Suicide Risk:  Minimal: No identifiable suicidal ideation.  Patients presenting with no risk factors but with morbid ruminations; may be classified as minimal risk based on the  severity of the depressive symptoms  Plan Of Care/Follow-up recommendations:  Continue activity as tolerated. Continue diet as recommended by your PCP. Ensure to keep all appointments with outpatient providers.  Disposition: Continue to follow up with the  local shelters with the list of resources provided. Continue taking medications as prescribed. Follow up with outpatient services at the [facility] for psychiatry and medication management.   Gerlene Burdock Jovonni Borquez, FNP 01/29/2020, 10:26 AM

## 2020-01-29 NOTE — ED Notes (Signed)
Pt sleeping at present,  Distress noted, calm & cooperative, Monitoring for safety.

## 2020-04-16 ENCOUNTER — Emergency Department (HOSPITAL_COMMUNITY)
Admission: EM | Admit: 2020-04-16 | Discharge: 2020-04-17 | Disposition: A | Discharge status: Home / Self Care | Payer: Medicaid Other | Attending: Emergency Medicine | Admitting: Emergency Medicine

## 2020-04-16 ENCOUNTER — Encounter (HOSPITAL_COMMUNITY): Payer: Self-pay

## 2020-04-16 ENCOUNTER — Other Ambulatory Visit: Payer: Self-pay

## 2020-04-16 ENCOUNTER — Emergency Department (HOSPITAL_COMMUNITY): Payer: Medicaid Other

## 2020-04-16 DIAGNOSIS — Y9361 Activity, american tackle football: Secondary | ICD-10-CM | POA: Insufficient documentation

## 2020-04-16 DIAGNOSIS — S76212A Strain of adductor muscle, fascia and tendon of left thigh, initial encounter: Secondary | ICD-10-CM

## 2020-04-16 DIAGNOSIS — S76211A Strain of adductor muscle, fascia and tendon of right thigh, initial encounter: Secondary | ICD-10-CM | POA: Diagnosis not present

## 2020-04-16 DIAGNOSIS — X58XXXA Exposure to other specified factors, initial encounter: Secondary | ICD-10-CM | POA: Diagnosis not present

## 2020-04-16 DIAGNOSIS — S76802A Unspecified injury of other specified muscles, fascia and tendons at thigh level, left thigh, initial encounter: Secondary | ICD-10-CM | POA: Diagnosis present

## 2020-04-16 LAB — POC URINE PREG, ED: Preg Test, Ur: NEGATIVE

## 2020-04-16 NOTE — ED Provider Notes (Signed)
[facility]-EMERGENCY DEPT Provider Note: Lowella Dell, MD, FACEP  CSN: [id number] MRN: [id number] ARRIVAL: 04/16/20 at 1857 ROOM: [facility]   CHIEF COMPLAINT  Hip Pain   HISTORY OF PRESENT ILLNESS  04/16/20 11:18 PM Chika Cichowski is a 19 y.o. female with 3 days of bilateral groin fold pain after playing football 3 days ago.  She attributes this to getting tackled.  She rates the pain is a 10 out of 10, aching in nature.  It is worse with ambulation but she is able to ambulate.  She has taken Tylenol and Percocet without relief.   Past Medical History:  Diagnosis Date  . ADHD (attention deficit hyperactivity disorder)     Past Surgical History:  Procedure Laterality Date  . extraction of wisdom teeth      Family History  Problem Relation Age of Onset  . Learning disabilities Mother   . Hypertension Maternal Grandmother   . Asthma Maternal Grandmother     Social History   Tobacco Use  . Smoking status: Never Smoker  . Smokeless tobacco: Never Used  Substance Use Topics  . Alcohol use: No  . Drug use: Yes    Types: Marijuana    Prior to Admission medications   Medication Sig Start Date End Date Taking? Authorizing Provider  gabapentin (NEURONTIN) 100 MG capsule Take 1 capsule (100 mg total) by mouth 3 (three) times daily. 01/29/20   Money, Gerlene Burdock, FNP  naproxen (NAPROSYN) 500 MG tablet Take 1 tablet twice daily as needed for pain. 04/17/20   Renn Dirocco, MD  QUEtiapine (SEROQUEL) 100 MG tablet Take 1 tablet (100 mg total) by mouth at bedtime. 01/29/20   Money, Gerlene Burdock, FNP    Allergies Kiwi extract and Peach [prunus persica]   REVIEW OF SYSTEMS  Negative except as noted here or in the History of Present Illness.   PHYSICAL EXAMINATION  Initial Vital Signs Blood pressure 121/71, pulse 62, temperature 98.8 F (37.1 C), temperature source Oral, resp. rate 16, SpO2 92 %.  Examination General: Well-developed, well-nourished female in no acute distress;  appearance consistent with age of record HENT: normocephalic; atraumatic Eyes: Normal appearance Neck: supple Heart: regular rate and rhythm Lungs: clear to auscultation bilaterally Abdomen: soft; nondistended; nontender; bowel sounds present Back: Nontender Extremities: No deformity; bilateral anterior groin fold tenderness; pulses normal Neurologic: Awake, alert and oriented; motor function intact in all extremities and symmetric; no facial droop Skin: Warm and dry Psychiatric: Normal mood and affect   RESULTS  Summary of this visit's results, reviewed and interpreted by myself:   EKG Interpretation  Date/Time:    Ventricular Rate:    PR Interval:    QRS Duration:   QT Interval:    QTC Calculation:   R Axis:     Text Interpretation:        Laboratory Studies: Results for orders placed or performed during the hospital encounter of 04/16/20 (from the past 24 hour(s))  POC urine preg, ED (not at [facility])     Status: None   Collection Time: 04/16/20 11:49 PM  Result Value Ref Range   Preg Test, Ur NEGATIVE NEGATIVE   Imaging Studies: DG Pelvis 1-2 Views  Result Date: 04/17/2020 CLINICAL DATA:  Bilateral hip/groin pain for 3 days after playing football. EXAM: PELVIS - 1-2 VIEW COMPARISON:  None. FINDINGS: There is no evidence of pelvic fracture or diastasis. No pelvic bone lesions are seen. IMPRESSION: Negative. Electronically Signed   By: Sebastian Ache M.D.   On:  04/17/2020 00:07    ED COURSE and MDM  Nursing notes, initial and subsequent vitals signs, including pulse oximetry, reviewed and interpreted by myself.  Vitals:   04/16/20 1905 04/16/20 2242  BP: 123/64 121/71  Pulse: 77 62  Resp: 16 16  Temp: 98.8 F (37.1 C)   TempSrc: Oral   SpO2: 100% 92%   Medications  naproxen (NAPROSYN) tablet 500 mg (has no administration in time range)    Patient's presentation is consistent with bilateral groin pull/strain.  We will treat with an NSAID.  No evidence of  fracture on radiograph and patient continues to to be able to ambulate.  PROCEDURES  Procedures   ED DIAGNOSES     ICD-10-CM   1. Inguinal strain, left, initial encounter  S76.212A   2. Inguinal strain, right, initial encounter  Q76.195K        Paula Libra, MD 04/17/20 [phone]

## 2020-04-16 NOTE — ED Triage Notes (Signed)
Pt presents via [facility] with c/o bilateral hip pain for 3 days after playing football. Pt reported that she tried Tylenol and Percocet with no relief. Pt ambulatory to triage.

## 2020-04-16 NOTE — ED Notes (Signed)
ED Provider at bedside. 

## 2020-04-16 NOTE — ED Notes (Signed)
Initial contact with pt, pt is awaiting provider. No signs of distress. Pt c/o hip pain

## 2020-04-17 MED ORDER — NAPROXEN 500 MG PO TABS
500.0000 mg | ORAL_TABLET | Freq: Once | ORAL | Status: AC
  Administered 2020-04-17: 500 mg via ORAL
  Filled 2020-04-17: qty 1

## 2020-04-17 MED ORDER — NAPROXEN 500 MG PO TABS: ORAL_TABLET | ORAL | 0 refills | Status: AC

## 2020-04-17 NOTE — ED Notes (Signed)
Markita Raynor from [facility] has called to get pt's information in detail. Pt sleeping at bedside.

## 2020-04-17 NOTE — ED Notes (Signed)
[facility] facilitator has been called and given situation and pt status.waiting on phone call for more instructions on what to do with pt. Charge nurse made aware.

## 2020-04-17 NOTE — ED Notes (Signed)
Attempted to call guardian on file with no success. Provider and charge nurse made aware. Per charge APS to be contacted. Pt remains sleep no changes in status.

## 2020-11-03 ENCOUNTER — Emergency Department (HOSPITAL_COMMUNITY)
Admission: EM | Admit: 2020-11-03 | Discharge: 2020-11-03 | Discharge status: Against Medical Advice | Payer: Medicaid Other

## 2020-11-03 NOTE — ED Notes (Signed)
No answer for triage x3 

## 2020-11-03 NOTE — ED Notes (Signed)
No answer for triage x1 

## 2020-11-06 ENCOUNTER — Emergency Department (HOSPITAL_COMMUNITY)
Admission: EM | Admit: 2020-11-06 | Discharge: 2020-11-06 | Disposition: A | Discharge status: Home / Self Care | Payer: Medicaid Other | Attending: Emergency Medicine | Admitting: Emergency Medicine

## 2020-11-06 ENCOUNTER — Encounter (HOSPITAL_COMMUNITY): Payer: Self-pay | Admitting: Emergency Medicine

## 2020-11-06 ENCOUNTER — Other Ambulatory Visit: Payer: Self-pay

## 2020-11-06 DIAGNOSIS — L0231 Cutaneous abscess of buttock: Secondary | ICD-10-CM | POA: Diagnosis not present

## 2020-11-06 DIAGNOSIS — R2242 Localized swelling, mass and lump, left lower limb: Secondary | ICD-10-CM | POA: Diagnosis present

## 2020-11-06 LAB — BASIC METABOLIC PANEL
Anion gap: 10 (ref 5–15)
BUN: 6 mg/dL (ref 6–20)
CO2: 21 mmol/L — ABNORMAL LOW (ref 22–32)
Calcium: 9.4 mg/dL (ref 8.9–10.3)
Chloride: 105 mmol/L (ref 98–111)
Creatinine, Ser: 0.85 mg/dL (ref 0.44–1.00)
GFR, Estimated: >60 mL/min (ref >60)
Glucose, Bld: 106 mg/dL — ABNORMAL HIGH (ref 70–99)
Potassium: 3.5 mmol/L (ref 3.5–5.1)
Sodium: 136 mmol/L (ref 135–145)

## 2020-11-06 LAB — CBC
HCT: 37.1 % (ref 36.0–46.0)
Hemoglobin: 11.5 g/dL — ABNORMAL LOW (ref 12.0–15.0)
MCH: 22.8 pg — ABNORMAL LOW (ref 26.0–34.0)
MCHC: 31 g/dL (ref 30.0–36.0)
MCV: 73.5 fL — ABNORMAL LOW (ref 80.0–100.0)
Platelets: 310 10*3/uL (ref 150–400)
RBC: 5.05 MIL/uL (ref 3.87–5.11)
RDW: 16.6 % — ABNORMAL HIGH (ref 11.5–15.5)
WBC: 15.7 10*3/uL — ABNORMAL HIGH (ref 4.0–10.5)
nRBC: 0 % (ref 0.0–0.2)

## 2020-11-06 LAB — LACTIC ACID, PLASMA: Lactic Acid, Venous: 1 mmol/L (ref 0.5–1.9)

## 2020-11-06 MED ORDER — DOXYCYCLINE HYCLATE 100 MG PO TABS
100.0000 mg | ORAL_TABLET | Freq: Once | ORAL | Status: AC
  Administered 2020-11-06: 100 mg via ORAL
  Filled 2020-11-06: qty 1

## 2020-11-06 MED ORDER — ONDANSETRON 4 MG PO TBDP
4.0000 mg | ORAL_TABLET | Freq: Once | ORAL | Status: AC
  Administered 2020-11-06: 4 mg via ORAL
  Filled 2020-11-06: qty 1

## 2020-11-06 MED ORDER — HYDROCODONE-ACETAMINOPHEN 5-325 MG PO TABS
2.0000 | ORAL_TABLET | Freq: Once | ORAL | Status: AC
Start: 2020-11-06 — End: 2020-11-06
  Administered 2020-11-06: 2 via ORAL
  Filled 2020-11-06: qty 2

## 2020-11-06 MED ORDER — DOXYCYCLINE HYCLATE 100 MG PO CAPS: 100.0000 mg | ORAL_CAPSULE | Freq: Two times a day (BID) | ORAL | 0 refills | Status: AC

## 2020-11-06 MED ORDER — ACETAMINOPHEN 325 MG PO TABS
650.0000 mg | ORAL_TABLET | Freq: Once | ORAL | Status: AC | PRN
  Administered 2020-11-06: 650 mg via ORAL
  Filled 2020-11-06: qty 2

## 2020-11-06 MED ORDER — HYDROCODONE-ACETAMINOPHEN 5-325 MG PO TABS: 1.0000 | ORAL_TABLET | Freq: Four times a day (QID) | ORAL | 0 refills | Status: AC | PRN

## 2020-11-06 NOTE — ED Provider Notes (Signed)
[facility] EMERGENCY DEPARTMENT Provider Note   CSN: [id number] Arrival date & time: 11/06/20  1838     History Chief Complaint  Patient presents with  . Abscess  . Fever    Recie Cirrincione is a 20 y.o. female hx of ADHD, here with L buttock abscess. She noticed swelling in the left buttock area for the last week or so.  She noticed drainage for the last 2 days.  She states that she has some pain as well.  Patient has some chills and was noted to be febrile in the ED.  Denies any history of MRSA.  Denies any history of recurrent abscesses  The history is provided by the patient.      Past Medical History:  Diagnosis Date  . ADHD (attention deficit hyperactivity disorder)     Patient Active Problem List   Diagnosis Date Noted  . Oppositional defiant disorder 06/29/2016  . BP check 08/07/2012  . Behavioral disorder 06/13/2011  . ADHD (attention deficit hyperactivity disorder) 06/13/2011  . 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 extraction of wisdom teeth       OB History   No obstetric history on file.     Family History  Problem Relation Age of Onset  . Learning disabilities Mother   . Hypertension Maternal Grandmother   . Asthma Maternal Grandmother     Social History   Tobacco Use  . Smoking status: Never  . Smokeless tobacco: Never  Substance Use Topics  . Alcohol use: No  . Drug use: Yes    Types: Marijuana    Home Medications Prior to Admission medications   Medication Sig Start Date End Date Taking? Authorizing Provider  gabapentin (NEURONTIN) 100 MG capsule Take 1 capsule (100 mg total) by mouth 3 (three) times daily. Patient not taking: No sig reported 01/29/20   Money, Gerlene Burdock, FNP  naproxen (NAPROSYN) 500 MG tablet Take 1 tablet twice daily as needed for pain. Patient not taking: No sig reported 04/17/20   Molpus, John, MD  QUEtiapine (SEROQUEL) 100  MG tablet Take 1 tablet (100 mg total) by mouth at bedtime. Patient not taking: No sig reported 01/29/20   Money, Gerlene Burdock, FNP    Allergies    Kiwi extract and Peach [prunus persica]  Review of Systems   Review of Systems  Skin:  Positive for color change.  All other systems reviewed and are negative.  Physical Exam Updated Vital Signs BP (!) 109/95   Pulse 87   Temp 98.5 F (36.9 C) (Oral)   Resp (!) 28   SpO2 100%   Physical Exam Vitals and nursing note reviewed.  Constitutional:      Comments: Uncomfortable and anxious  HENT:     Head: Normocephalic.     Right Ear: Tympanic membrane normal.     Left Ear: Tympanic membrane normal.     Nose: Nose normal.     Mouth/Throat:     Mouth: Mucous membranes are moist.  Eyes:     Extraocular Movements: Extraocular movements intact.     Pupils: Pupils are equal, round, and reactive to light.  Cardiovascular:     Rate and Rhythm: Normal rate and regular rhythm.     Pulses: Normal pulses.  Pulmonary:     Effort: Pulmonary effort is normal.  Abdominal:     General: Abdomen is flat.  Genitourinary:    Comments: Inner part  of the left buttock there is a area of abscess that is draining already.  Does not involve the vulva or the rectum Musculoskeletal:     Cervical back: Normal range of motion.  Skin:    General: Skin is warm.     Capillary Refill: Capillary refill takes less than 2 seconds.  Neurological:     General: No focal deficit present.     Mental Status: She is oriented to person, place, and time.  Psychiatric:        Mood and Affect: Mood normal.        Behavior: Behavior normal.    ED Results / Procedures / Treatments   Labs (all labs ordered are listed, but only abnormal results are displayed) Labs Reviewed  CBC - Abnormal; Notable for the following components:      Result Value   WBC 15.7 (*)    Hemoglobin 11.5 (*)    MCV 73.5 (*)    MCH 22.8 (*)    RDW 16.6 (*)    All other components within normal  limits  BASIC METABOLIC PANEL - Abnormal; Notable for the following components:   CO2 21 (*)    Glucose, Bld 106 (*)    All other components within normal limits  LACTIC ACID, PLASMA  LACTIC ACID, PLASMA    EKG None  Radiology No results found.  Procedures Procedures   Medications Ordered in ED Medications  acetaminophen (TYLENOL) tablet 650 mg (650 mg Oral Given 11/06/20 1853)  doxycycline (VIBRA-TABS) tablet 100 mg (100 mg Oral Given 11/06/20 2118)  HYDROcodone-acetaminophen (NORCO/VICODIN) 5-325 MG per tablet 2 tablet (2 tablets Oral Given 11/06/20 2118)    ED Course  I have reviewed the triage vital signs and the nursing notes.  Pertinent labs & imaging results that were available during my care of the patient were reviewed by me and considered in my medical decision making (see chart for details).    MDM Rules/Calculators/A&P                         Remington Skalsky is a 20 y.o. female here presenting with left buttock abscess.  It is self draining already.  Patient has some surrounding cellulitis.  Since its draining already, I would commend warm compresses and wound check in 2 days and discharged home with doxycycline.   Final Clinical Impression(s) / ED Diagnoses Final diagnoses:  None    Rx / DC Orders ED Discharge Orders     None        Charlynne Pander, MD 11/06/20 2223

## 2020-11-06 NOTE — Discharge Instructions (Addendum)
Take motrin, tylenol for pain   Take doxycycline twice daily for a week   Take vicodin for severe pain   Use warm soaks and sitz bath   Return in 2 days for wound check   Return to ER if you have worse abscess, vomiting, fever.

## 2020-11-06 NOTE — ED Triage Notes (Signed)
Patient from home, abscess on buttocks for 2 weeks.

## 2021-08-21 IMAGING — CR DG PELVIS 1-2V
1 series · 1 of 1 positions shown · non-contrast
Comparison: None.

CLINICAL DATA: Bilateral hip/groin pain for 3 days after playing
football.

EXAM:
PELVIS - 1-2 VIEW

[t pelvis ap]
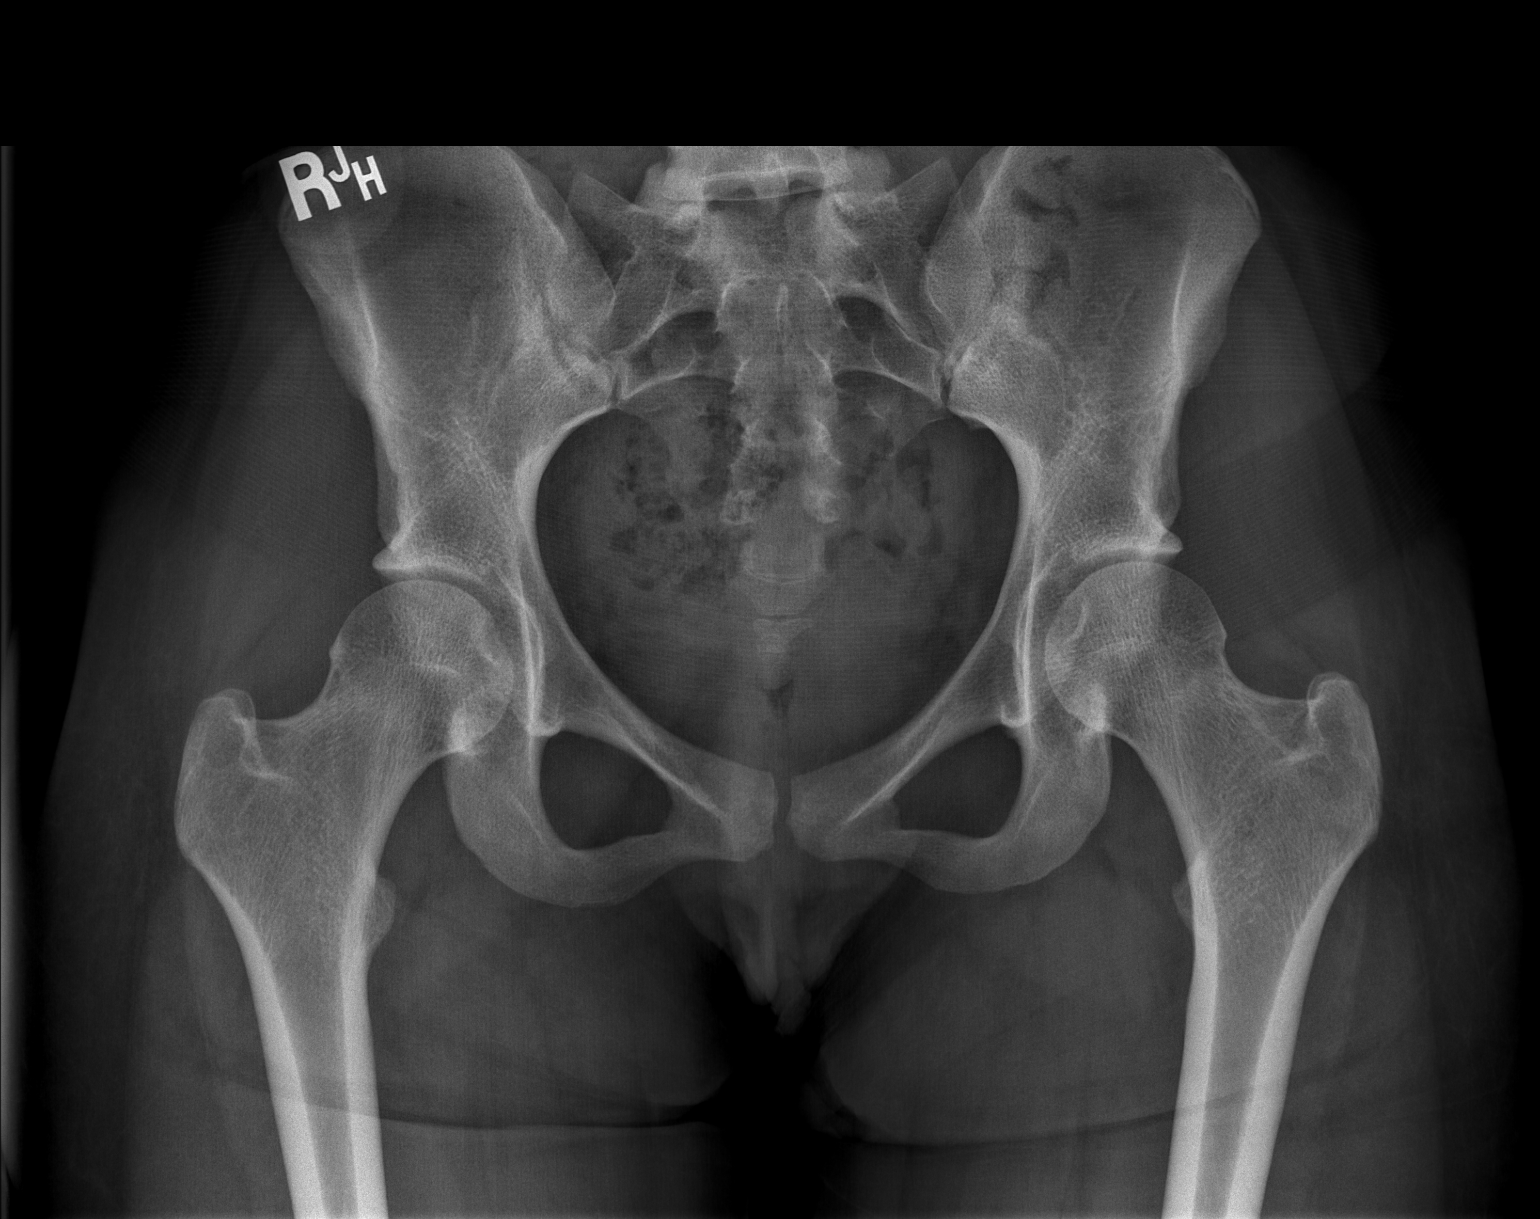

[1 of 1 positions shown; findings below may reference images not displayed]

FINDINGS: There is no evidence of pelvic fracture or diastasis. No pelvic bone
lesions are seen.
IMPRESSION: Negative.

## 2023-03-12 ENCOUNTER — Emergency Department
Admission: EM | Admit: 2023-03-12 | Discharge: 2023-03-13 | Disposition: A | Discharge status: Home / Self Care | Payer: MEDICAID | Attending: Emergency Medicine | Admitting: Emergency Medicine

## 2023-03-12 ENCOUNTER — Other Ambulatory Visit: Payer: Self-pay

## 2023-03-12 DIAGNOSIS — R45851 Suicidal ideations: Secondary | ICD-10-CM

## 2023-03-12 DIAGNOSIS — D649 Anemia, unspecified: Secondary | ICD-10-CM | POA: Insufficient documentation

## 2023-03-12 LAB — COMPREHENSIVE METABOLIC PANEL
ALT: 13 U/L (ref 0–44)
AST: 21 U/L (ref 15–41)
Albumin: 4.6 g/dL (ref 3.5–5.0)
Alkaline Phosphatase: 44 U/L (ref 38–126)
Anion gap: 6 (ref 5–15)
BUN: 9 mg/dL (ref 6–20)
CO2: 24 mmol/L (ref 22–32)
Calcium: 8.8 mg/dL — ABNORMAL LOW (ref 8.9–10.3)
Chloride: 106 mmol/L (ref 98–111)
Creatinine, Ser: 0.73 mg/dL (ref 0.44–1.00)
GFR, Estimated: >60 mL/min (ref >60)
Glucose, Bld: 138 mg/dL — ABNORMAL HIGH (ref 70–99)
Potassium: 3.3 mmol/L — ABNORMAL LOW (ref 3.5–5.1)
Sodium: 136 mmol/L (ref 135–145)
Total Bilirubin: 0.4 mg/dL (ref <1.2)
Total Protein: 8.2 g/dL — ABNORMAL HIGH (ref 6.5–8.1)

## 2023-03-12 LAB — CBC
HCT: 32.7 % — ABNORMAL LOW (ref 36.0–46.0)
Hemoglobin: 10.3 g/dL — ABNORMAL LOW (ref 12.0–15.0)
MCH: 24.5 pg — ABNORMAL LOW (ref 26.0–34.0)
MCHC: 31.5 g/dL (ref 30.0–36.0)
MCV: 77.7 fL — ABNORMAL LOW (ref 80.0–100.0)
Platelets: 276 10*3/uL (ref 150–400)
RBC: 4.21 MIL/uL (ref 3.87–5.11)
RDW: 14.4 % (ref 11.5–15.5)
WBC: 8.3 10*3/uL (ref 4.0–10.5)
nRBC: 0 % (ref 0.0–0.2)

## 2023-03-12 LAB — ETHANOL: Alcohol, Ethyl (B): <10 mg/dL (ref <10)

## 2023-03-12 LAB — SALICYLATE LEVEL: Salicylate Lvl: <7 mg/dL — ABNORMAL LOW (ref 7.0–30.0)

## 2023-03-12 LAB — ACETAMINOPHEN LEVEL: Acetaminophen (Tylenol), Serum: <10 ug/mL — ABNORMAL LOW (ref 10–30)

## 2023-03-12 NOTE — ED Triage Notes (Signed)
Pt presents to ER with c/o SI.  Pt states she has been dealing with suicidal thoughts for the last few days.  Pt states she has plan to wrap an aux cord around her neck.  Pt states she has attempted suicide multiple times, and has hx of bipolar, depression, and ODD.  Pt is otherwise alert and in NAD at this time.

## 2023-03-12 NOTE — ED Notes (Signed)
Pt. Transferred to B[facility] , room# 4 from triage .Patient was screened by security before entering the unit. Received Report and Recommendations from triage RN . Pt. Oriented to unit including Q15 minute rounding as well as locked bathroom protocol, meal / snack schedule, and  the security cameras in place for their protection. Patient is A/O x 4, warm / dry.  Showing no acute signs of distress. Staff to monitor as ordered

## 2023-03-12 NOTE — ED Notes (Signed)
Pt dressed out into appropriate hospital provided attire with this tech and [employer], NT in the rm. Pt belongings consist of: one pair of blue Nike tennis shoes, blue sweat pants, one black jacket, one blue jean jacket, one blue t-shirt, one cell phone with fully intact screen, one cell phone with a cracked screen, one pair of white socks, and one white bra. Pt belongings placed into one pt belongings bag and labeled with pt name. Pt calm and cooperative while dressing out.

## 2023-03-12 NOTE — ED Notes (Signed)
Vol/Pending placement  

## 2023-03-12 NOTE — ED Provider Notes (Signed)
[facility] Provider Note    Event Date/Time   First MD Initiated Contact with Patient 03/12/23 [phone]     (approximate)   History   Suicidal   HPI  Kristen Boone is a 22 y.o. female with history of ADHD, bipolar disorder who presents to the emergency department with 2 weeks of suicidal thoughts.  She has had previous inpatient psychiatric admissions and she feels she needs to be admitted today.  No HI or hallucinations.  Occasionally uses marijuana.  States she is supposed to be taking Risperdal, trazodone and another psychiatric medication that she cannot recall the name of.  She does not know the doses of her medications.   History provided by patient.    Past Medical History:  Diagnosis Date   ADHD (attention deficit hyperactivity disorder)     Past Surgical History:  Procedure Laterality Date   extraction of wisdom teeth      MEDICATIONS:  Prior to Admission medications   Medication Sig Start Date End Date Taking? Authorizing Provider  doxycycline (VIBRAMYCIN) 100 MG capsule Take 1 capsule (100 mg total) by mouth 2 (two) times daily. One po bid x 7 days 11/06/20   Charlynne Pander, MD  gabapentin (NEURONTIN) 100 MG capsule Take 1 capsule (100 mg total) by mouth 3 (three) times daily. Patient not taking: No sig reported 01/29/20   Money, Gerlene Burdock, FNP  HYDROcodone-acetaminophen (NORCO/VICODIN) 5-325 MG tablet Take 1 tablet by mouth every 6 (six) hours as needed. 11/06/20   Charlynne Pander, MD  naproxen (NAPROSYN) 500 MG tablet Take 1 tablet twice daily as needed for pain. Patient not taking: No sig reported 04/17/20   Molpus, John, MD  QUEtiapine (SEROQUEL) 100 MG tablet Take 1 tablet (100 mg total) by mouth at bedtime. Patient not taking: No sig reported 01/29/20   Money, Gerlene Burdock, FNP    Physical Exam   Triage Vital Signs: ED Triage Vitals  Encounter Vitals Group     BP 03/12/23 0417 (!) 115/54     Systolic BP Percentile --       Diastolic BP Percentile --      Pulse Rate 03/12/23 0417 72     Resp 03/12/23 0417 18     Temp 03/12/23 0417 97.7 F (36.5 C)     Temp Source 03/12/23 0417 Oral     SpO2 03/12/23 0417 100 %     Weight 03/12/23 0417 120 lb (54.4 kg)     Height 03/12/23 0417 5\' 3"  (1.6 m)     Head Circumference --      Peak Flow --      Pain Score 03/12/23 0431 0     Pain Loc --      Pain Education --      Exclude from Growth Chart --     Most recent vital signs: Vitals:   03/12/23 0417  BP: (!) 115/54  Pulse: 72  Resp: 18  Temp: 97.7 F (36.5 C)  SpO2: 100%    CONSTITUTIONAL: Alert, responds appropriately to questions. Well-appearing; well-nourished HEAD: Normocephalic, atraumatic EYES: Conjunctivae clear, pupils appear equal, sclera nonicteric ENT: normal nose; moist mucous membranes NECK: Supple, normal ROM CARD: RRR; S1 and S2 appreciated RESP: Normal chest excursion without splinting or tachypnea; breath sounds clear and equal bilaterally; no wheezes, no rhonchi, no rales, no hypoxia or respiratory distress, speaking full sentences ABD/GI: Non-distended; soft, non-tender, no rebound, no guarding, no peritoneal signs BACK: The back appears normal EXT:  Normal ROM in all joints; no deformity noted, no edema SKIN: Normal color for age and race; warm; no rash on exposed skin NEURO: Moves all extremities equally, normal speech PSYCH: Flat affect.  States she is feeling suicidal.  No HI or hallucinations.  Not responding to internal stimuli.   ED Results / Procedures / Treatments   LABS: (all labs ordered are listed, but only abnormal results are displayed) Labs Reviewed  COMPREHENSIVE METABOLIC PANEL - Abnormal; Notable for the following components:      Result Value   Potassium 3.3 (*)    Glucose, Bld 138 (*)    Calcium 8.8 (*)    Total Protein 8.2 (*)    All other components within normal limits  SALICYLATE LEVEL - Abnormal; Notable for the following components:   Salicylate Lvl  <7.0 (*)    All other components within normal limits  ACETAMINOPHEN LEVEL - Abnormal; Notable for the following components:   Acetaminophen (Tylenol), Serum <10 (*)    All other components within normal limits  CBC - Abnormal; Notable for the following components:   Hemoglobin 10.3 (*)    HCT 32.7 (*)    MCV 77.7 (*)    MCH 24.5 (*)    All other components within normal limits  ETHANOL  URINE DRUG SCREEN, QUALITATIVE ([facility] ONLY)  POC URINE PREG, ED     EKG:  RADIOLOGY: My personal review and interpretation of imaging:    I have personally reviewed all radiology reports.   No results found.   PROCEDURES:  Critical Care performed: No      Procedures    IMPRESSION / MDM / ASSESSMENT AND PLAN / ED COURSE  I reviewed the triage vital signs and the nursing notes.    Patient here with suicidal thoughts.    DIFFERENTIAL DIAGNOSIS (includes but not limited to):   Bipolar disorder, depression, suicidal thoughts   Patient's presentation is most consistent with acute presentation with potential threat to life or bodily function.   PLAN: Will obtain screening labs, urine.  Will consult psychiatry and TTS.  Patient is here voluntarily at this time.   MEDICATIONS GIVEN IN ED: Medications - No data to display   ED COURSE: 5:35 AM  Labs showed hemoglobin 10.3.  Has chronic anemia and this appears stable.  Normal electrolytes, renal function.  Negative ethanol, Tylenol, salicylate level.  Patient medically cleared at this time.     CONSULTS:  TTS and psychiatry consulted for further disposition.   OUTSIDE RECORDS REVIEWED: Reviewed last psychiatric notes in 2021.       FINAL CLINICAL IMPRESSION(S) / ED DIAGNOSES   Final diagnoses:  Suicidal ideation     Rx / DC Orders   ED Discharge Orders     None        Note:  This document was prepared using Dragon voice recognition software and may include unintentional dictation errors.   Eual Lindstrom, Layla Maw, DO 03/12/23 [phone]

## 2023-03-12 NOTE — ED Notes (Signed)
Pt given PM snack at this time.  

## 2023-03-12 NOTE — Consult Note (Signed)
[facility] Face-to-Face Psychiatry Consult   Reason for Consult:  SI Referring Physician:  Dr. Layla Maw Ward Patient Identification: Kristen Boone MRN:  [id number] Principal Diagnosis: Suicidal ideation Diagnosis:  Principal Problem:   Suicidal ideation  Total Time spent with patient: 45 minutes  Subjective:   Kristen Boone is a 22 y.o. female patient w/ hx of ODD, behavioral disorder, ADHD, borderline intellectual disability admitted to [facility] on 03/12/23. Per triage note, Kristen Duffel, RN:   Pt presents to ER with c/o SI.  Pt states she has been dealing with suicidal thoughts for the last few days.  Pt states she has plan to wrap an aux cord around her neck.  Pt states she has attempted suicide multiple times, and has hx of bipolar, depression, and ODD.  Pt is otherwise alert and in NAD at this time.       HPI:   Pt chart reviewed and assessed face to face. Reports feeling depressed for the past week and having suicidal thoughts for the past 3 or 4 days. In the past week moved from [place] to [place] with her girlfriend and her girlfriend's sister. She and her girlfriend had been dating for about 3 weeks. Last night she and her girlfriend broke up after argument over her girlfriend's dog. She reports following break up with her girlfriend, she wrapped an aux cord around her neck and pulled for 2 seconds. No ligature marks noted on pt's neck. Pt is now hopeful to move back to [place] where her family lives. Reports her mother, father, brother and sisters live in [place]. She reports she also receives her medical care in [place].   Pt denies suicidal, homicidal ideations. She denies auditory visual hallucinations or paranoia.   She denies history of non suicidal self injurious behavior. She endorses history of 1 suicide attempt at 22 y/o when she attempted to jump out the window but was stopped by her cousin. She endorses history of 3 inpatient psychiatric hospitalizations.   She  denies knowledge of family psychiatric history.   She reports daily use of marijuana, last use was last night. Denies use of alcohol, nicotine, crack/cocaine, methamphetamines, or other substances.   Pt reports she is unemployed. She is trying to get on disability for "little touch of autism, adhd, mental health". Reports she is working with [facility] caseworker Katrina for this.   She gives verbal consent to contact her brother, grandmother, mother, father. States she does not have their phone numbers although we should have grandmother's number as her emergency contact in her chart. States she primarily speaks with family members by [employer]. Attempted to call pt's grandmother 4 times without success.  Past Psychiatric History: ODD, behavioral disorder, ADHD, borderline intellectual disability  Risk to Self: Denies suicidal ideations Risk to Others: Denies homicidal ideations Prior Inpatient Therapy: Yes Prior Outpatient Therapy: Yes  Past Medical History:  Past Medical History:  Diagnosis Date   ADHD (attention deficit hyperactivity disorder)     Past Surgical History:  Procedure Laterality Date   extraction of wisdom teeth     Family History:  Family History  Problem Relation Age of Onset   Learning disabilities Mother    Hypertension Maternal Grandmother    Asthma Maternal Grandmother    Family Psychiatric  History: Denies knowledge Social History:  Social History   Substance and Sexual Activity  Alcohol Use No     Social History   Substance and Sexual Activity  Drug Use Yes   Types: Marijuana  Social History   Socioeconomic History   Marital status: Single    Spouse name: Not on file   Number of children: Not on file   Years of education: Not on file   Highest education level: Not on file  Occupational History   Not on file  Tobacco Use   Smoking status: Never   Smokeless tobacco: Never  Substance and Sexual Activity   Alcohol use: No   Drug use: Yes     Types: Marijuana   Sexual activity: Never  Other Topics Concern   Not on file  Social History Narrative   [employer]   5 th grade.   Grandmother has custody.   IQ of 74.   IEP in reading and math., mainstream classes.   ADHD - on metadate CD.   Dr.Spencer- optho.   Nose bleeds -  Cauterized bt Dr. Suszanne Conners   Social Determinants of Health   Financial Resource Strain: Not on file  Food Insecurity: Not on file  Transportation Needs: Not on file  Physical Activity: Not on file  Stress: Not on file  Social Connections: Not on file   Allergies:   Allergies  Allergen Reactions   Lactose Intolerance (Gi) Other (See Comments)    GI Upset   Kiwi Extract Hives   Peach [Prunus Persica] Rash   Labs:  Results for orders placed or performed during the hospital encounter of 03/12/23 (from the past 48 hour(s))  Comprehensive metabolic panel     Status: Abnormal   Collection Time: 03/12/23  4:22 AM  Result Value Ref Range   Sodium 136 135 - 145 mmol/L   Potassium 3.3 (L) 3.5 - 5.1 mmol/L   Chloride 106 98 - 111 mmol/L   CO2 24 22 - 32 mmol/L   Glucose, Bld 138 (H) 70 - 99 mg/dL    Comment: Glucose reference range applies only to samples taken after fasting for at least 8 hours.   BUN 9 6 - 20 mg/dL   Creatinine, Ser 1.61 0.44 - 1.00 mg/dL   Calcium 8.8 (L) 8.9 - 10.3 mg/dL   Total Protein 8.2 (H) 6.5 - 8.1 g/dL   Albumin 4.6 3.5 - 5.0 g/dL   AST 21 15 - 41 U/L   ALT 13 0 - 44 U/L   Alkaline Phosphatase 44 38 - 126 U/L   Total Bilirubin 0.4 <1.2 mg/dL   GFR, Estimated >09 >60 mL/min    Comment: (NOTE) Calculated using the CKD-EPI Creatinine Equation (2021)    Anion gap 6 5 - 15    Comment: Performed at [facility], [address] [place], [place] [address]  Ethanol     Status: None   Collection Time: 03/12/23  4:22 AM  Result Value Ref Range   Alcohol, Ethyl (B) <10 <10 mg/dL    Comment: (NOTE) Lowest detectable limit for serum alcohol is 10  mg/dL.  For medical purposes only. Performed at [facility], [address] [place], [place] [address]   Salicylate level     Status: Abnormal   Collection Time: 03/12/23  4:22 AM  Result Value Ref Range   Salicylate Lvl <7.0 (L) 7.0 - 30.0 mg/dL    Comment: Performed at [facility], [address] [place], [place] [address]  Acetaminophen level     Status: Abnormal   Collection Time: 03/12/23  4:22 AM  Result Value Ref Range   Acetaminophen (Tylenol), Serum <10 (L) 10 - 30 ug/mL  Comment: (NOTE) Therapeutic concentrations vary significantly. A range of 10-30 ug/mL  may be an effective concentration for many patients. However, some  are best treated at concentrations outside of this range. Acetaminophen concentrations >150 ug/mL at 4 hours after ingestion  and >50 ug/mL at 12 hours after ingestion are often associated with  toxic reactions.  Performed at [facility], [address] [place], [place] [address]   cbc     Status: Abnormal   Collection Time: 03/12/23  4:22 AM  Result Value Ref Range   WBC 8.3 4.0 - 10.5 K/uL   RBC 4.21 3.87 - 5.11 MIL/uL   Hemoglobin 10.3 (L) 12.0 - 15.0 g/dL   HCT 60.4 (L) 54.0 - 98.1 %   MCV 77.7 (L) 80.0 - 100.0 fL   MCH 24.5 (L) 26.0 - 34.0 pg   MCHC 31.5 30.0 - 36.0 g/dL   RDW 19.1 47.8 - 29.5 %   Platelets 276 150 - 400 K/uL   nRBC 0.0 0.0 - 0.2 %    Comment: Performed at [facility], [address] [place], [place] [address]    No current facility-administered medications for this encounter.   Current Outpatient Medications  Medication Sig Dispense Refill   doxycycline (VIBRAMYCIN) 100 MG capsule Take 1 capsule (100 mg total) by mouth 2 (two) times daily. One po bid x 7 days (Patient not taking: Reported on 03/12/2023) 14 capsule 0   gabapentin (NEURONTIN) 100 MG capsule Take 1 capsule (100 mg total) by mouth 3 (three) times daily. (Patient not taking: Reported on 11/06/2020) 90 capsule  0   HYDROcodone-acetaminophen (NORCO/VICODIN) 5-325 MG tablet Take 1 tablet by mouth every 6 (six) hours as needed. (Patient not taking: Reported on 03/12/2023) 10 tablet 0   naproxen (NAPROSYN) 500 MG tablet Take 1 tablet twice daily as needed for pain. (Patient not taking: Reported on 11/06/2020) 20 tablet 0   QUEtiapine (SEROQUEL) 100 MG tablet Take 1 tablet (100 mg total) by mouth at bedtime. (Patient not taking: Reported on 11/06/2020) 30 tablet 0   Musculoskeletal: Strength & Muscle Tone: within normal limits Gait & Station: normal Patient leans: N/A  Psychiatric Specialty Exam:  Presentation  General Appearance:  Appropriate for Environment  Eye Contact: Fair  Speech: Blocked  Speech Volume: Normal  Handedness: Right   Mood and Affect  Mood: Euthymic  Affect: Flat   Thought Process  Thought Processes: Coherent  Descriptions of Associations:Intact  Orientation:Full (Time, Place and Person)  Thought Content:WDL  History of Schizophrenia/Schizoaffective disorder:No Duration of Psychotic Symptoms:Not applicable Hallucinations:Hallucinations: None  Ideas of Reference:None  Suicidal Thoughts:Suicidal Thoughts: No  Homicidal Thoughts:Homicidal Thoughts: No   Sensorium  Memory: Immediate Fair  Judgment: Fair  Insight: Fair   Art therapist  Concentration: Fair  Attention Span: Fair  Recall: [employer] of Knowledge: Fair  Language: Fair   Psychomotor Activity  Psychomotor Activity: Psychomotor Activity: Normal   Assets  Assets: Communication Skills; Desire for Improvement; Physical Health; Leisure Time   Sleep  Sleep: Sleep: Fair   Physical Exam: Physical Exam Constitutional:      General: She is not in acute distress.    Appearance: She is not ill-appearing, toxic-appearing or diaphoretic.  Eyes:     General: No scleral icterus. Cardiovascular:     Rate and Rhythm: Normal rate.  Pulmonary:     Effort:  Pulmonary effort is normal. No respiratory distress.  Neurological:     Mental Status: She is alert and oriented to  person, place, and time.  Psychiatric:        Attention and Perception: Attention and perception normal.        Mood and Affect: Mood normal. Affect is flat.        Speech: Speech is delayed.        Behavior: Behavior normal. Behavior is cooperative.        Thought Content: Thought content normal.    Review of Systems  Constitutional:  Negative for chills and fever.  Respiratory:  Negative for shortness of breath.   Cardiovascular:  Negative for chest pain and palpitations.  Gastrointestinal:  Negative for abdominal pain.  Neurological:  Negative for headaches.  Psychiatric/Behavioral: Negative.     Blood pressure (!) 115/54, pulse 72, temperature 97.7 F (36.5 C), temperature source Oral, resp. rate 18, height 5\' 3"  (1.6 m), weight 54.4 kg, last menstrual period 03/08/2023, SpO2 100%. Body mass index is 21.26 kg/m.  Treatment Plan Summary: Daily contact with patient to assess and evaluate symptoms and progress in treatment and Plan    22 y/o female w/ history of ODD, behavior disorder, ADHD, borderline intellectual disability presenting voluntarily to [facility] for depression for the past week and suicidal ideations for the past 3 to 4 days. Denies current suicidal, homicidal ideations. Denies auditory visual hallucinations or paranoia. Does feel she can keep herself safe. Will continue attempts at reaching collateral and if pt continues to deny suicidal ideations will consider discharge tomorrow with recommendation for outpatient behavioral health services.  Disposition:  Will continue attempts at reaching collateral. If pt continues to deny suicidal ideations, will consider discharge tomorrow with recommendation for outpatient behavioral health services.  Lauree Chandler, NP 03/12/2023 5:10 PM

## 2023-03-12 NOTE — ED Notes (Signed)
Hospital meal provided, pt tolerated w/o complaints.  Waste discarded appropriately.  

## 2023-03-12 NOTE — BH Assessment (Signed)
Comprehensive Clinical Assessment (CCA) Screening, Triage and Referral Note  03/12/2023 Kristen Boone [id number]  Sherald Hess, 22 year old female who presents to [facility] ED voluntarily for treatment. Per triage note, Pt presents to ER with c/o SI.  Pt states she has been dealing with suicidal thoughts for the last few days.  Pt states she has plan to wrap an aux cord around her neck.  Pt states she has attempted suicide multiple times, and has hx of bipolar, depression, and ODD.  Pt is otherwise alert and in NAD at this time.     During TTS assessment pt presents alert and oriented x 4, restless but cooperative, and mood-congruent with affect. The pt does not appear to be responding to internal or external stimuli. Neither is the pt presenting with any delusional thinking. Pt verified the information provided to triage RN.   Pt identifies her main complaint to be that she "was not feeling like herself." Patient reports she got into an argument with her girlfriend of 3 weeks about their dog. Patient says it was a "stupid argument"; however, they have broken up and she just wants to go back to [place] with her family. Patient says she agreed to move to [facility] with her ex and the ex's sister but since the argument, patient has been sleeping outside. Patient reports daily marijuana use and recently used last night. Pt reports 3 INPT hospitalizations but is not currently being followed by a provider. Patient admits she attempted to kill herself by wrapping an aux cord around her neck last night. Patient denies self-injurious behaviors. Patient says she plans to live with her older brother and is working on getting her disability services reinstated. Patient reports disability with her mental health. "I have autism and ADHD."  Pt reports family hx of MH with her mom. Pt denies current SI/HI/AH/VH. Pt contracts for safety.   Disposition pending collateral.   Chief Complaint:  Chief Complaint  Patient  presents with   Suicidal   Visit Diagnosis: Suicidal ideation  Patient Reported Information How did you hear about [place]? Self  What Is the Reason for Your Visit/Call Today? Suicidal thoughts  How Long Has This Been Causing You Problems? > than 6 months  What Do You Feel Would Help You the Most Today? -- (Assessment only)   Have You Recently Had Any Thoughts About Hurting Yourself? Yes  Are You Planning to Commit Suicide/Harm Yourself At This time? No   Have you Recently Had Thoughts About Hurting Someone [name]? No  Are You Planning to Harm Someone at This Time? No  Explanation: No data recorded  Have You Used Any Alcohol or Drugs in the Past 24 Hours? Yes  How Long Ago Did You Use Drugs or Alcohol? No data recorded What Did You Use and How Much? Marijuana   Do You Currently Have a Therapist/Psychiatrist? No  Name of Therapist/Psychiatrist: No data recorded  Have You Been Recently Discharged From Any Office Practice or Programs? No  Explanation of Discharge From Practice/Program: No data recorded   CCA Screening Triage Referral Assessment Type of Contact: Face-to-Face  Telemedicine Service Delivery:   Is this Initial or Reassessment?   Date Telepsych consult ordered in [facility]:    Time Telepsych consult ordered in [facility]:    Location of Assessment: [facility] ED  Provider Location: [facility] ED    Collateral Involvement: None provided   Does Patient Have a Court Appointed Legal Guardian? No data recorded Name and Contact of Legal Guardian: No  data recorded If Minor and Not Living with Parent(s), Who has Custody? No data recorded Is CPS involved or ever been involved? No data recorded Is APS involved or ever been involved? No data recorded  Patient Determined To Be At Risk for Harm To Self or Others Based on Review of Patient Reported Information or Presenting Complaint? No  Method: Plan without intent  Availability of Means: No data recorded Intent: Vague intent or  NA  Notification Required: No need or identified person  Additional Information for Danger to Others Potential: No data recorded Additional Comments for Danger to Others Potential: No data recorded Are There Guns or Other Weapons in Your Home? No data recorded Types of Guns/Weapons: No data recorded Are These Weapons Safely Secured?                            No data recorded Who Could Verify You Are Able To Have These Secured: No data recorded Do You Have any Outstanding Charges, Pending Court Dates, Parole/Probation? No data recorded Contacted To Inform of Risk of Harm To Self or Others: No data recorded  Does Patient Present under Involuntary Commitment? No    [place] of Residence: [place]   Patient Currently Receiving the Following Services: Not Receiving Services   Determination of Need: Emergent (2 hours)   Options For Referral: ED Visit; Medication Management; Therapeutic Triage Services   Discharge Disposition:     Clerance Lav, Counselor, LCAS-A

## 2023-03-13 DIAGNOSIS — R45851 Suicidal ideations: Secondary | ICD-10-CM | POA: Diagnosis not present

## 2023-03-13 NOTE — ED Notes (Signed)
Report to katie, rn 

## 2023-03-13 NOTE — ED Notes (Signed)
Report from amy, rn.  

## 2023-03-13 NOTE — Consult Note (Signed)
[facility] Face-to-Face Psychiatry Consult   Reason for Consult:  SI Referring Physician:  Dr. Layla Maw Ward Patient Identification: Kristen Boone MRN:  [id number] Principal Diagnosis: Suicidal ideation Diagnosis:  Principal Problem:   Suicidal ideation  Total Time spent with patient:  25 minutes  Subjective:   Kristen Boone is a 22 y.o. female patient w/ hx of ODD, behavioral disorder, ADHD, borderline intellectual disability admitted to [facility] on 03/12/23. Per triage note, Kristen Duffel, RN:    Pt presents to ER with c/o SI.  Pt states she has been dealing with suicidal thoughts for the last few days.  Pt states she has plan to wrap an aux cord around her neck.  Pt states she has attempted suicide multiple times, and has hx of bipolar, depression, and ODD.  Pt is otherwise alert and in NAD at this time.     HPI:   Pt chart reviewed and assessed face to face. Reports "good" mood today. Denies suicidal, homicidal ideations. Denies auditory visual hallucinations or paranoia. Reports appetite and sleep have been good. She verbalizes readiness for discharge. She will be going to the [facility] in [place]. She is familiar with the [facility] and has been there before. She has a caseworker there who she needs to check in with. Attempted to call pt's grandmother Kristen Boone [phone] twice today and was unable to reach her. Discussed have attempted to reach pt's grandmother and it was not successful. Pt verbalized understanding.   Past Psychiatric History: ODD, behavioral disorder, ADHD, borderline intellectual disability  Risk to Self: Denies suicidal ideations Risk to Others: Denies homicidal ideations Prior Inpatient Therapy: Yes Prior Outpatient Therapy: Yes  Past Medical History:  Past Medical History:  Diagnosis Date   ADHD (attention deficit hyperactivity disorder)     Past Surgical History:  Procedure Laterality Date   extraction of wisdom teeth     Family History:  Family History   Problem Relation Age of Onset   Learning disabilities Mother    Hypertension Maternal Grandmother    Asthma Maternal Grandmother    Family Psychiatric  History: Denies knowledge Social History:  Social History   Substance and Sexual Activity  Alcohol Use No     Social History   Substance and Sexual Activity  Drug Use Yes   Types: Marijuana    Social History   Socioeconomic History   Marital status: Single    Spouse name: Not on file   Number of children: Not on file   Years of education: Not on file   Highest education level: Not on file  Occupational History   Not on file  Tobacco Use   Smoking status: Never   Smokeless tobacco: Never  Substance and Sexual Activity   Alcohol use: No   Drug use: Yes    Types: Marijuana   Sexual activity: Never  Other Topics Concern   Not on file  Social History Narrative   [employer]   5 th grade.   Grandmother has custody.   IQ of 74.   IEP in reading and math., mainstream classes.   ADHD - on metadate CD.   Dr.Spencer- optho.   Nose bleeds -  Cauterized bt Dr. Suszanne Conners   Social Determinants of Health   Financial Resource Strain: Not on file  Food Insecurity: Not on file  Transportation Needs: Not on file  Physical Activity: Not on file  Stress: Not on file  Social Connections: Not on file   Allergies:   Allergies  Allergen Reactions   Lactose Intolerance (Gi) Other (See Comments)    GI Upset   Kiwi Extract Hives   Peach [Prunus Persica] Rash   Labs:  Results for orders placed or performed during the hospital encounter of 03/12/23 (from the past 48 hour(s))  Comprehensive metabolic panel     Status: Abnormal   Collection Time: 03/12/23  4:22 AM  Result Value Ref Range   Sodium 136 135 - 145 mmol/L   Potassium 3.3 (L) 3.5 - 5.1 mmol/L   Chloride 106 98 - 111 mmol/L   CO2 24 22 - 32 mmol/L   Glucose, Bld 138 (H) 70 - 99 mg/dL    Comment: Glucose reference range applies only to samples taken after  fasting for at least 8 hours.   BUN 9 6 - 20 mg/dL   Creatinine, Ser 1.61 0.44 - 1.00 mg/dL   Calcium 8.8 (L) 8.9 - 10.3 mg/dL   Total Protein 8.2 (H) 6.5 - 8.1 g/dL   Albumin 4.6 3.5 - 5.0 g/dL   AST 21 15 - 41 U/L   ALT 13 0 - 44 U/L   Alkaline Phosphatase 44 38 - 126 U/L   Total Bilirubin 0.4 <1.2 mg/dL   GFR, Estimated >09 >60 mL/min    Comment: (NOTE) Calculated using the CKD-EPI Creatinine Equation (2021)    Anion gap 6 5 - 15    Comment: Performed at [facility], [address] [place], [place] [address]  Ethanol     Status: None   Collection Time: 03/12/23  4:22 AM  Result Value Ref Range   Alcohol, Ethyl (B) <10 <10 mg/dL    Comment: (NOTE) Lowest detectable limit for serum alcohol is 10 mg/dL.  For medical purposes only. Performed at [facility], [address] [place], [place] [address]   Salicylate level     Status: Abnormal   Collection Time: 03/12/23  4:22 AM  Result Value Ref Range   Salicylate Lvl <7.0 (L) 7.0 - 30.0 mg/dL    Comment: Performed at [facility], [address] [place], [place] [address]  Acetaminophen level     Status: Abnormal   Collection Time: 03/12/23  4:22 AM  Result Value Ref Range   Acetaminophen (Tylenol), Serum <10 (L) 10 - 30 ug/mL    Comment: (NOTE) Therapeutic concentrations vary significantly. A range of 10-30 ug/mL  may be an effective concentration for many patients. However, some  are best treated at concentrations outside of this range. Acetaminophen concentrations >150 ug/mL at 4 hours after ingestion  and >50 ug/mL at 12 hours after ingestion are often associated with  toxic reactions.  Performed at [facility], [address] [place], [place] [address]   cbc     Status: Abnormal   Collection Time: 03/12/23  4:22 AM  Result Value Ref Range   WBC 8.3 4.0 - 10.5 K/uL   RBC 4.21 3.87 - 5.11 MIL/uL   Hemoglobin 10.3 (L) 12.0 - 15.0 g/dL   HCT 08.6 (L) 57.8 - 46.9 %   MCV  77.7 (L) 80.0 - 100.0 fL   MCH 24.5 (L) 26.0 - 34.0 pg   MCHC 31.5 30.0 - 36.0 g/dL   RDW 62.9 52.8 - 41.3 %   Platelets 276 150 - 400 K/uL   nRBC 0.0 0.0 - 0.2 %    Comment: Performed at [facility], [address] [place], [place] [address]    No current facility-administered medications for this  encounter.   Current Outpatient Medications  Medication Sig Dispense Refill   doxycycline (VIBRAMYCIN) 100 MG capsule Take 1 capsule (100 mg total) by mouth 2 (two) times daily. One po bid x 7 days (Patient not taking: Reported on 03/12/2023) 14 capsule 0   gabapentin (NEURONTIN) 100 MG capsule Take 1 capsule (100 mg total) by mouth 3 (three) times daily. (Patient not taking: Reported on 11/06/2020) 90 capsule 0   HYDROcodone-acetaminophen (NORCO/VICODIN) 5-325 MG tablet Take 1 tablet by mouth every 6 (six) hours as needed. (Patient not taking: Reported on 03/12/2023) 10 tablet 0   naproxen (NAPROSYN) 500 MG tablet Take 1 tablet twice daily as needed for pain. (Patient not taking: Reported on 11/06/2020) 20 tablet 0   QUEtiapine (SEROQUEL) 100 MG tablet Take 1 tablet (100 mg total) by mouth at bedtime. (Patient not taking: Reported on 11/06/2020) 30 tablet 0   Musculoskeletal: Strength & Muscle Tone: within normal limits Gait & Station: normal Patient leans: N/A  Psychiatric Specialty Exam:  Presentation  General Appearance:  Appropriate for Environment  Eye Contact: Fair  Speech: Blocked; Other (comment) (consistent with intellectual disability)  Speech Volume: Normal  Handedness: Right   Mood and Affect  Mood: -- ("good")  Affect: Flat   Thought Process  Thought Processes: Coherent; Goal Directed; Linear  Descriptions of Associations:Intact  Orientation:Full (Time, Place and Person)  Thought Content:WDL  History of Schizophrenia/Schizoaffective disorder:No Duration of Psychotic Symptoms:Not applicable Hallucinations:Hallucinations: None  Ideas of  Reference:None  Suicidal Thoughts:Suicidal Thoughts: No  Homicidal Thoughts:Homicidal Thoughts: No   Sensorium  Memory: Immediate Fair  Judgment: Fair  Insight: Fair   Art therapist  Concentration: Fair  Attention Span: Fair  Recall: [employer] of Knowledge: Fair  Language: Fair   Psychomotor Activity  Psychomotor Activity: Psychomotor Activity: Normal   Assets  Assets: Communication Skills; Desire for Improvement; Physical Health; Leisure Time   Sleep  Sleep: Sleep: Good   Physical Exam: Physical Exam Constitutional:      General: She is not in acute distress.    Appearance: She is not ill-appearing, toxic-appearing or diaphoretic.  Eyes:     General: No scleral icterus. Cardiovascular:     Rate and Rhythm: Normal rate.  Pulmonary:     Effort: Pulmonary effort is normal. No respiratory distress.  Neurological:     Mental Status: She is alert and oriented to person, place, and time.  Psychiatric:        Attention and Perception: Attention and perception normal.        Mood and Affect: Mood normal. Affect is flat.        Speech: Speech is delayed.        Behavior: Behavior normal. Behavior is cooperative.        Thought Content: Thought content normal.    Review of Systems  Constitutional:  Negative for chills and fever.  Respiratory:  Negative for shortness of breath.   Cardiovascular:  Negative for chest pain and palpitations.  Gastrointestinal:  Negative for abdominal pain.  Neurological:  Negative for headaches.  Psychiatric/Behavioral: Negative.     Blood pressure (!) 100/59, pulse 75, temperature 98.9 F (37.2 C), temperature source Oral, resp. rate 16, height 5\' 3"  (1.6 m), weight 54.4 kg, last menstrual period 03/08/2023, SpO2 98%. Body mass index is 21.26 kg/m.  Treatment Plan Summary: Plan    21 y/o female w/ history of ODD, behavior disorder, ADHD, borderline intellectual disability presenting voluntarily to [facility]  for depression for the past week  and suicidal ideations for the past 3 to 4 days. Pt was assessed yesterday by me during which she denied suicidal, homicidal ideations, auditory visual hallucinations or paranoia and felt she can keep herself safe. Attempted collateral yesterday and today which was unsuccessful. On assessment today, she continues to deny suicidal, homicidal ideations, auditory visual hallucinations or paranoia. She has had no significant events since her admission. Today she verbalizes readiness to discharge. Pt would like to return to [place] where she has family support. She will be discharged to the [facility] where she has a IT trainer and needs to check in.  Disposition: No evidence of imminent risk to self or others at present.   Patient does not meet criteria for psychiatric inpatient admission. Supportive therapy provided about ongoing stressors. Discussed crisis plan, support from social network, calling 911, coming to the Emergency Department, and calling Suicide Hotline.  Kristen Chandler, NP 03/13/2023 8:20 AM

## 2023-03-13 NOTE — ED Notes (Signed)
Pt is A/Ox4, Ms Dicola declines any SI/HI stated that she is not having any A/V hallucinations.  Discharge instructions reviewed with Mikaylin, they verbalized understanding.  All Belongings accounted for and returned to PT.  Pt left ambulatory via [employer]

## 2023-03-13 NOTE — ED Notes (Signed)
Attempted to reach pt's grandmother / emergency contact Lacerte,Debra ([phone] (Mobile) per pt request.  There was no answer, HIPAA compliant message left.

## 2023-03-13 NOTE — ED Provider Notes (Signed)
Emergency Medicine Observation Re-evaluation Note  Physical Exam   BP 112/61 (BP Location: Right Arm)   Pulse 71   Temp 98.5 F (36.9 C)   Resp 17   Ht 5\' 3"  (1.6 m)   Wt 54.4 kg   LMP 03/08/2023   SpO2 99%   BMI 21.26 kg/m   Patient appears in no acute distress.  ED Course / MDM   No reported events during my shift at the time of this note.   Patient is cleared by psychiatry for discharge.   Pilar Jarvis MD    Pilar Jarvis, MD 03/13/23 [phone]

## 2023-03-13 NOTE — ED Notes (Signed)
Hospital meal provided, pt tolerated w/o complaints.  Waste discarded appropriately. Staff informed pt that cab will be called when she has finished her breakfast. Pt is agreeable

## 2023-03-13 NOTE — ED Notes (Signed)
Vol/consult done/will continue attempts at reaching collateral. If pt continues to deny suicidal ideations, will consider discharge tomorrow with recommendation for outpatient behavioral health services per NP.

## 2023-12-02 ENCOUNTER — Emergency Department: Admit: 2023-12-02 | Payer: Medicaid (Managed Care)

## 2023-12-02 ENCOUNTER — Inpatient Hospital Stay: Admit: 2023-12-02 | Discharge: 2023-12-02 | Disposition: A | Discharge status: Home / Self Care | Arrived: WI

## 2023-12-02 MED ORDER — NAPROXEN 250 MG PO TABS
250 | Freq: Once | ORAL | Status: AC
Start: 2023-12-02 — End: 2023-12-02
  Administered 2023-12-02: 23:00:00 375 mg via ORAL

## 2023-12-02 MED ORDER — NAPROXEN 250 MG PO TABS
250 | ORAL_TABLET | Freq: Two times a day (BID) | ORAL | 0 refills | 14.00000 days | Status: AC
Start: 2023-12-02 — End: ?

## 2023-12-02 MED FILL — NAPROXEN 250 MG PO TABS: 250 mg | ORAL | Qty: 2 | Fill #0

## 2023-12-02 NOTE — ED Provider Notes (Signed)
[facility] EMERGENCY DEPARTMENT  EMERGENCY DEPARTMENT ENCOUNTER      Pt Name: Anne Hensley  MRN: [id number]  Birthdate 29-Sep-2000  Date of evaluation: 12/02/2023  Provider: Donnice CHRISTELLA Sharps, APRN - CNP      HISTORY OF PRESENT ILLNESS    Anne Hensley is a 23 y.o. female who presents to the Emergency Department with patient reporting right shoulder pain starting over a week ago.  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  She also walks her dog frequently on a leash she is right-handed.  She has occasional shooting pains into the right upper arm denies numbness or tingling.  Denies neck pain        REVIEW OF SYSTEMS       Review of Systems   Constitutional: Negative.    Respiratory: Negative.     Cardiovascular: Negative.    Genitourinary: Negative.    Musculoskeletal:  Positive for arthralgias and myalgias. Negative for joint swelling, neck pain and neck stiffness.   Skin:  Negative for color change.   Neurological: Negative.  Negative for numbness.   Hematological: Negative.    Psychiatric/Behavioral: Negative.           PAST MEDICAL HISTORY   No past medical history on file.      SURGICAL HISTORY     No past surgical history on file.      CURRENT MEDICATIONS       Current Discharge Medication List        CONTINUE these medications which have NOT CHANGED    Details   fluconazole  (DIFLUCAN ) 150 MG tablet Take 1 tablet every 3 days for 3 doses.  For example:  Take on Monday, Thursday, Sunday.  Qty: 3 tablet, Refills: 1    Associated Diagnoses: Candidal vaginitis             ALLERGIES     Patient has no known allergies.    FAMILY HISTORY     No family history on file.       SOCIAL HISTORY       Social History     Socioeconomic History    Marital status: Single   Tobacco Use    Smoking status: Every Day    Smokeless tobacco: Never   Substance and Sexual Activity    Alcohol use: Yes     Alcohol/week: 0.0 - 1.0 standard drinks of alcohol     Comment: occ    Drug  use: Yes     Types: Marijuana Oda)    Sexual activity: Not Currently       SCREENINGS    Glasgow Coma Scale  Eye Opening: Spontaneous  Best Verbal Response: Oriented  Best Motor Response: Obeys commands  Glasgow Coma Scale Score: 15          Physical Exam  Vitals reviewed.   Constitutional:       Appearance: Normal appearance.   Cardiovascular:      Rate and Rhythm: Normal rate.      Pulses: Normal pulses.   Pulmonary:      Effort: Pulmonary effort is normal.   Abdominal:      General: Abdomen is flat.   Musculoskeletal:         General: No swelling, deformity or signs of injury. Normal range of motion.      Right shoulder: Tenderness, bony tenderness and crepitus present. No swelling, deformity, effusion or laceration. Normal range of  motion. Normal strength. Normal pulse.      Right upper arm: No swelling, tenderness or bony tenderness.        Arms:       Cervical back: Full passive range of motion without pain and normal range of motion. No pain with movement or spinous process tenderness. Normal range of motion.   Skin:     General: Skin is warm and dry.      Capillary Refill: Capillary refill takes less than 2 seconds.      Findings: No bruising, erythema or lesion.   Neurological:      Mental Status: She is alert and oriented to person, place, and time. Mental status is at baseline.      Sensory: Sensation is intact.   Psychiatric:         Mood and Affect: Mood normal.           All other labs were within normal range or not returned as of this dictation.    EMERGENCY DEPARTMENT COURSE and DIFFERENTIALDIAGNOSIS/MDM:   Vitals:    Vitals:    12/02/23 1836   BP: 120/89   Pulse: (!) 110   Resp: 18   Temp: 98.3 F (36.8 C)   TempSrc: Oral   SpO2: 100%   Weight: 41.7 kg (92 lb)   Height: 1.499 m (4' 11)            Anne Hensley is a 23 y.o. female who presents to the Emergency Department with right shoulder pain greater than 1 week no injury.  Reports pain when moving the shoulder raising the arm.  On exam  shoulder is normal appearance no deformity no bruising reports bony tenderness to the lateral side and around the posterior side of shoulder denies AC joint tenderness.  Normal abduction and abduction.  X-ray shoulder right 2 view negative.    Medications given in ER:  Naproxen  375 mg p.o.    LABS:  Labs Reviewed - No data to display        Imaging:    THREE XRAY VIEWS OF THE RIGHT SHOULDER     12/02/2023 6:55 pm     COMPARISON:  None.     HISTORY:  ORDERING SYSTEM PROVIDED HISTORY: pain  TECHNOLOGIST PROVIDED HISTORY:  Reason for exam:->pain  What reading provider will be dictating this exam?->CRC     FINDINGS:  Glenohumeral joint is normally aligned.  No evidence of acute fracture or  dislocation.  No abnormal periarticular calcifications.  The [facility] joint is  unremarkable in appearance.     Visualized lung is unremarkable.     IMPRESSION:  No acute abnormality.       PROCEDURES:  Unless otherwise noted below, none     Procedures    My attending is: Dr Randine      FINAL IMPRESSION      1. Strain of right shoulder, initial encounter          DISPOSITION/PLAN   DISPOSITION Decision To Discharge 12/02/2023 07:18:24 PM   DISPOSITION CONDITION Stable     Educated patient on RICE therapy    Advise follow-up with orthopedics if symptoms not improving with rest ice elevation and naproxen .        Discharge Prescriptions:    Naproxen  375 mg twice daily as needed    Coding     Donnice CHRISTELLA Sharps, APRN - CNP (electronically signed)  Attending Emergency Physician      Sharps Donnice CHRISTELLA, APRN - CNP  12/02/23 1926

## 2023-12-02 NOTE — ED Triage Notes (Signed)
 Patient c/o of right shoulder pain x1 week. 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 Patient denied any n/t to arm.

## 2024-02-28 ENCOUNTER — Encounter: Attending: Family
